# Patient Record
Sex: Male | Born: 1965 | Race: White | Hispanic: No | Marital: Married | Smoking: Never smoker
Health system: Southern US, Community
[De-identification: ages and names within clinical notes are randomized; demographics above are authoritative.]

## PROBLEM LIST (undated history)

## (undated) DIAGNOSIS — I10 Essential (primary) hypertension: Secondary | ICD-10-CM

## (undated) DIAGNOSIS — J189 Pneumonia, unspecified organism: Secondary | ICD-10-CM

## (undated) DIAGNOSIS — K219 Gastro-esophageal reflux disease without esophagitis: Secondary | ICD-10-CM

## (undated) DIAGNOSIS — F410 Panic disorder [episodic paroxysmal anxiety] without agoraphobia: Secondary | ICD-10-CM

## (undated) DIAGNOSIS — H1010 Acute atopic conjunctivitis, unspecified eye: Secondary | ICD-10-CM

---

## 2011-05-27 ENCOUNTER — Encounter (HOSPITAL_COMMUNITY): Payer: Self-pay | Admitting: *Deleted

## 2011-05-27 ENCOUNTER — Emergency Department (HOSPITAL_COMMUNITY): Payer: BC Managed Care – PPO

## 2011-05-27 ENCOUNTER — Observation Stay (HOSPITAL_COMMUNITY)
Admission: EM | Admit: 2011-05-27 | Discharge: 2011-05-28 | Disposition: A | Discharge status: Home / Self Care | Payer: BC Managed Care – PPO | Attending: Emergency Medicine | Admitting: Emergency Medicine

## 2011-05-27 ENCOUNTER — Other Ambulatory Visit: Payer: Self-pay

## 2011-05-27 DIAGNOSIS — R0602 Shortness of breath: Secondary | ICD-10-CM | POA: Insufficient documentation

## 2011-05-27 DIAGNOSIS — R61 Generalized hyperhidrosis: Secondary | ICD-10-CM | POA: Insufficient documentation

## 2011-05-27 DIAGNOSIS — I10 Essential (primary) hypertension: Secondary | ICD-10-CM | POA: Insufficient documentation

## 2011-05-27 DIAGNOSIS — F410 Panic disorder [episodic paroxysmal anxiety] without agoraphobia: Secondary | ICD-10-CM | POA: Insufficient documentation

## 2011-05-27 DIAGNOSIS — R079 Chest pain, unspecified: Principal | ICD-10-CM | POA: Insufficient documentation

## 2011-05-27 DIAGNOSIS — J9859 Other diseases of mediastinum, not elsewhere classified: Secondary | ICD-10-CM

## 2011-05-27 DIAGNOSIS — R42 Dizziness and giddiness: Secondary | ICD-10-CM | POA: Insufficient documentation

## 2011-05-27 DIAGNOSIS — R110 Nausea: Secondary | ICD-10-CM | POA: Insufficient documentation

## 2011-05-27 HISTORY — DX: Gastro-esophageal reflux disease without esophagitis: K21.9

## 2011-05-27 HISTORY — DX: Essential (primary) hypertension: I10

## 2011-05-27 HISTORY — DX: Panic disorder (episodic paroxysmal anxiety): F41.0

## 2011-05-27 LAB — POCT I-STAT TROPONIN I: Troponin i, poc: 0 ng/mL (ref 0.00–0.08)

## 2011-05-27 LAB — POCT I-STAT, CHEM 8
BUN: 12 mg/dL (ref 6–23)
Creatinine, Ser: 1 mg/dL (ref 0.50–1.35)
Glucose, Bld: 117 mg/dL — ABNORMAL HIGH (ref 70–99)
Hemoglobin: 16.3 g/dL (ref 13.0–17.0)
Potassium: 3.4 mEq/L — ABNORMAL LOW (ref 3.5–5.1)

## 2011-05-27 LAB — CBC
Hemoglobin: 15.9 g/dL (ref 13.0–17.0)
MCHC: 34.7 g/dL (ref 30.0–36.0)
Platelets: 200 10*3/uL (ref 150–400)
RDW: 13.2 % (ref 11.5–15.5)

## 2011-05-27 MED ORDER — LORAZEPAM 1 MG PO TABS
0.5000 mg | ORAL_TABLET | Freq: Once | ORAL | Status: AC
Start: 2011-05-27 — End: 2011-05-27
  Administered 2011-05-27: 0.5 mg via ORAL
  Filled 2011-05-27: qty 1

## 2011-05-27 NOTE — ED Notes (Signed)
BMI 34.7; spoke with PA- meets criteria for CTA

## 2011-05-27 NOTE — ED Notes (Signed)
Please call pt's wife if pt's status changes Marti [phone]

## 2011-05-27 NOTE — ED Notes (Signed)
Pt states that intermittent CP started 45 minutes ago with nausea, diaphoresis, radiation to left arm. Pain feels like pressure. Pain relived with burping. Pain exacerbated by nothing.

## 2011-05-27 NOTE — ED Provider Notes (Signed)
History     CSN: [id number]  Arrival date & time 05/27/11  2042   First MD Initiated Contact with Patient 05/27/11 2159      Chief Complaint  Patient presents with  . Chest Pain    (Consider location/radiation/quality/duration/timing/severity/associated sxs/prior treatment) HPI Cc SS CP onset around 8pm today, nonradiating, associated with sob, lightheadedness, diaphoresis.  Cp intermittent, no aggravating or alleviating factors.  Past Medical History  Diagnosis Date  . Panic attacks   . Hypertension     History reviewed. No pertinent past surgical history.  Family History  Problem Relation Age of Onset  . Stroke Father   . Cancer Father   . Cancer Brother     History  Substance Use Topics  . Smoking status: Not on file  . Smokeless tobacco: Never Used  . Alcohol Use: No      Review of Systems  Respiratory: Positive for shortness of breath.   Cardiovascular: Positive for chest pain.  Gastrointestinal: Positive for nausea.  Neurological: Positive for light-headedness.  All other systems reviewed and are negative.    Allergies  Review of patient's allergies indicates no known allergies.  Home Medications   Current Outpatient Rx  Name Route Sig Dispense Refill  . LISINOPRIL 10 MG PO TABS Oral Take 10 mg by mouth daily.    Marland Kitchen LORAZEPAM 0.5 MG PO TABS Oral Take 0.5 mg by mouth every 8 (eight) hours.    Marland Kitchen METOPROLOL SUCCINATE ER 50 MG PO TB24 Oral Take 25 mg by mouth daily. Take with or immediately following a meal.    . OMEPRAZOLE 40 MG PO CPDR Oral Take 40 mg by mouth daily.    Marland Kitchen PAROXETINE HCL 10 MG PO TABS Oral Take 10 mg by mouth every morning.    Marland Kitchen PRESCRIPTION MEDICATION Oral Take 1 tablet by mouth 2 (two) times daily. Oral Testosterone supplement (troche)      BP 147/80  Pulse 60  Temp(Src) 98.6 F (37 C) (Oral)  Resp 20  Ht 6\' 2"  (1.88 m)  Wt 270 lb (122.471 kg)  BMI 34.67 kg/m2  SpO2 96%  Physical Exam  Nursing note and vitals  reviewed. Constitutional: He appears well-developed and well-nourished.  HENT:  Head: Normocephalic and atraumatic.  Eyes: Right eye exhibits no discharge. Left eye exhibits no discharge.  Neck: Normal range of motion. Neck supple.  Cardiovascular: Normal rate, regular rhythm and normal heart sounds.   Pulmonary/Chest: Effort normal and breath sounds normal.  Abdominal: Soft. There is no tenderness.  Musculoskeletal: Normal range of motion. He exhibits no tenderness.  Neurological: He is alert.  Skin: Skin is warm and dry.  Psychiatric: He has a normal mood and affect. His behavior is normal.    ED Course  Procedures (including critical care time)  Labs Reviewed  POCT I-STAT, CHEM 8 - Abnormal; Notable for the following:    Potassium 3.4 (*)    Glucose, Bld 117 (*)    All other components within normal limits  CBC  POCT I-STAT TROPONIN I  POCT I-STAT TROPONIN I   Dg Chest 2 View  05/27/2011  *RADIOLOGY REPORT*  Clinical Data: Chest pain, upper chest pressure, shortness of breath, hot flashes, numbness and tingling left hand, history hypertension  CHEST - 2 VIEW  Comparison: None  Findings: Borderline enlargement of cardiac silhouette. Slight pulmonary vascular congestion. Mediastinal contours normal. Lungs clear. No pleural effusion or pneumothorax. Bones unremarkable.  IMPRESSION: No acute abnormalities.  Original Report Authenticated By: [facility].  Tyron Russell, M.D.     No diagnosis found.   EKG: normal EKG, normal sinus rhythm, there are no previous tracings available for comparison.  MDM  Pt with h/ o htn but no other cad risk factors.  ekg nl.  Low risk for acs but story is concerning.  PERC neg doubt pe.  Doubt dissection, esophageal injury.  No infectious sx's.  Could be associated with anxiety as pt states he has panic attacks that have presented similarly yet never lasted this long.  First trop wnl.  Pt placed in CDU for obs and CT in the am.       Elijio Miles,  MD 05/28/11 [phone]

## 2011-05-27 NOTE — ED Notes (Signed)
Pt c/o chest pain/pressure that is centralized and in the upper chest.  Denies radiation.  Pt [place] at mall and began having pressure and sweating.  Claims nausea but denies V/D.  Pt has history of panic attacks but states he has never had the sweating and return pressure with tingling in left arm and hand.  Alert oriented X4 family sitting with patient.   Pt reports taking testosterone for past two months and wonders if this has caused chest pain

## 2011-05-27 NOTE — ED Provider Notes (Signed)
Pt moved to CDU on chest pain protocol. Pt with CP started just prior to arrival, associated with diaphoresis, radiated to left arm. Pt is now CP free, low risk for ACS. Pt will have Cardiac CT in AM. Pt's BMI is 34.  His VS currently are normal. Pt in NAD. AAOx3. Regular HR and rhythm. Lungs clear to auscultation bilaterally. No LE edema. Pt will be monitored over night.     Lottie Mussel, PA 05/27/11 2320

## 2011-05-28 ENCOUNTER — Other Ambulatory Visit: Payer: Self-pay

## 2011-05-28 ENCOUNTER — Observation Stay (HOSPITAL_COMMUNITY): Payer: BC Managed Care – PPO

## 2011-05-28 ENCOUNTER — Encounter (HOSPITAL_COMMUNITY): Payer: Self-pay | Admitting: *Deleted

## 2011-05-28 LAB — POCT I-STAT TROPONIN I
Troponin i, poc: 0 ng/mL (ref 0.00–0.08)
Troponin i, poc: 0 ng/mL (ref 0.00–0.08)

## 2011-05-28 MED ORDER — METOPROLOL TARTRATE 25 MG PO TABS
100.0000 mg | ORAL_TABLET | Freq: Once | ORAL | Status: AC
  Administered 2011-05-28: 100 mg via ORAL
  Filled 2011-05-28: qty 4

## 2011-05-28 MED ORDER — ACETAMINOPHEN 325 MG PO TABS
650.0000 mg | ORAL_TABLET | Freq: Once | ORAL | Status: AC
  Administered 2011-05-28: 650 mg via ORAL
  Filled 2011-05-28: qty 2

## 2011-05-28 MED ORDER — IOHEXOL 350 MG/ML SOLN
80.0000 mL | Freq: Once | INTRAVENOUS | Status: AC | PRN
  Administered 2011-05-28: 80 mL via INTRAVENOUS

## 2011-05-28 MED ORDER — NITROGLYCERIN 0.4 MG SL SUBL
0.4000 mg | SUBLINGUAL_TABLET | Freq: Once | SUBLINGUAL | Status: AC
  Administered 2011-05-28: 0.4 mg via SUBLINGUAL
  Filled 2011-05-28: qty 25

## 2011-05-28 MED ORDER — METOPROLOL TARTRATE 1 MG/ML IV SOLN
5.0000 mg | Freq: Once | INTRAVENOUS | Status: AC
  Administered 2011-05-28: 5 mg via INTRAVENOUS
  Filled 2011-05-28: qty 10

## 2011-05-28 MED ORDER — LORAZEPAM 1 MG PO TABS
1.0000 mg | ORAL_TABLET | Freq: Once | ORAL | Status: AC
  Administered 2011-05-28: 1 mg via ORAL
  Filled 2011-05-28: qty 1

## 2011-05-28 NOTE — Discharge Instructions (Signed)
As we discussed, there was possible calcification in a tiny branch of the vessels on your heart. As your only risk factor is high blood pressure, no additional consultation is needed in the ER at this time. You should discuss these results with Dr Clifton Custard.  In addition, the 1.4cm soft tissue mass in the mediastinum (the space between the lungs) should be evaluated with a CT of the chest. This can be ordered by Dr Clifton Custard.     Chest Pain (Nonspecific) It is often hard to give a specific diagnosis for the cause of chest pain. There is always a chance that your pain could be related to something serious, such as a heart attack or a blood clot in the lungs. You need to follow up with your caregiver for further evaluation. CAUSES   Heartburn.   Pneumonia or bronchitis.   Anxiety or stress.   Inflammation around your heart (pericarditis) or lung (pleuritis or pleurisy).   A blood clot in the lung.   A collapsed lung (pneumothorax). It can develop suddenly on its own (spontaneous pneumothorax) or from injury (trauma) to the chest.   Shingles infection (herpes zoster virus).  The chest wall is composed of bones, muscles, and cartilage. Any of these can be the source of the pain.  The bones can be bruised by injury.   The muscles or cartilage can be strained by coughing or overwork.   The cartilage can be affected by inflammation and become sore (costochondritis).  DIAGNOSIS  Lab tests or other studies, such as X-rays, electrocardiography, stress testing, or cardiac imaging, may be needed to find the cause of your pain.  TREATMENT   Treatment depends on what may be causing your chest pain. Treatment may include:   Acid blockers for heartburn.   Anti-inflammatory medicine.   Pain medicine for inflammatory conditions.   Antibiotics if an infection is present.   You may be advised to change lifestyle habits. This includes stopping smoking and avoiding alcohol, caffeine, and chocolate.     You may be advised to keep your head raised (elevated) when sleeping. This reduces the chance of acid going backward from your stomach into your esophagus.   Most of the time, nonspecific chest pain will improve within 2 to 3 days with rest and mild pain medicine.  HOME CARE INSTRUCTIONS   If antibiotics were prescribed, take your antibiotics as directed. Finish them even if you start to feel better.   For the next few days, avoid physical activities that bring on chest pain. Continue physical activities as directed.   Do not smoke.   Avoid drinking alcohol.   Only take over-the-counter or prescription medicine for pain, discomfort, or fever as directed by your caregiver.   Follow your caregiver's suggestions for further testing if your chest pain does not go away.   Keep any follow-up appointments you made. If you do not go to an appointment, you could develop lasting (chronic) problems with pain. If there is any problem keeping an appointment, you must call to reschedule.  SEEK MEDICAL CARE IF:   You think you are having problems from the medicine you are taking. Read your medicine instructions carefully.   Your chest pain does not go away, even after treatment.   You develop a rash with blisters on your chest.  SEEK IMMEDIATE MEDICAL CARE IF:   You have increased chest pain or pain that spreads to your arm, neck, jaw, back, or abdomen.   You develop shortness of breath,  an increasing cough, or you are coughing up blood.   You have severe back or abdominal pain, feel nauseous, or vomit.   You develop severe weakness, fainting, or chills.   You have a fever.  THIS IS AN EMERGENCY. Do not wait to see if the pain will go away. Get medical help at once. Call your local emergency services (911 in [place] Do not drive yourself to the hospital. MAKE SURE YOU:   Understand these instructions.   Will watch your condition.   Will get help right away if you are not doing well or  get worse.  Document Released: 11/30/2004 Document Revised: 02/09/2011 Document Reviewed: 09/26/2007 [facility] Patient Information 2012 [place], [place].

## 2011-05-28 NOTE — ED Provider Notes (Signed)
7:52 AM Pt care assumed in CDU at am shift change. 46 y/o M with hx HTN, anxiety on chest pain protocol. Short episode yesterday substernal CP radiating to left arm with assoc nausea, diaphoresis, mild SOB. Symptoms resolved spontaneously. No personal or family hx CAD. Negative troponin x 4. Nml EKG. Awaiting coronary CT.   On my assessment, pt is awake, alert, oriented, and in NAD. Lungs CTAB. Heart RRR. Abd soft, NT, nml bowel sounds. MAEW without edema to BLE. Speech clear. No needs at this time.        11:54 AM Pt still with no chest pain.   05/28/2011  *RADIOLOGY REPORT*  INDICATION:  Acute onset of substernal chest pain.  History of hypertension and gastroesophageal reflux disease.  CT ANGIOGRAPHY OF THE HEART, CORONARY ARTERY, STRUCTURE, AND MORPHOLOGY  CONTRAST: 80mL OMNIPAQUE IOHEXOL 350 MG/ML IV SOLN  COMPARISON:  Plain film of 1 day prior.  No prior CT.  TECHNIQUE:  CT angiography of the coronary vessels was performed on a 256 channel system using prospective ECG gating.  A scout and noncontrast exam (for calcium scoring) were performed.  Circulation time was measured using a test bolus.  Coronary CTA was performed with sub mm slice collimation during portions of the cardiac cycle after prior injection of iodinated contrast.  Imaging post processing was performed on an independent workstation creating multiplanar and 3-D images, and quantitative analysis of the heart and coronary arteries.  Note that this exam targets the heart and the chest was not imaged in its entirety.  PREMEDICATION: Lopressor 100 mg, P.O. Lopressor 5.0 mg, IV Nitroglycerin 0.4 mcg, sublingual.  FINDINGS: Technical quality:  Good.  Heart rate:  54  CORONARY ARTERIES: Left main coronary artery:  A short vessel, arising from the left coronary cusp.  Within normal limits.  A ramus branch which is a moderate-sized branching.  Without significant disease. Left anterior descending:  Gives rise to a moderate-sized, branching  diagonal.  This may be the source of the questionable calcification on unenhanced imaging.  No significant stenosis. Continues as a long vessel, giving rise to a second tiny patent diagonal.  Wraps around cardiac apex, where it supplies a moderate sized distal diagonal, which is patent. Left circumflex:  Immediately gives rise to a diminutive marginal branch, which is patent.  A moderate sized, nondominant vessel. Gives rise to a moderate to large marginal branch, which is branching and negative.  Continues as a moderate sized patent distal vessel.  Supplies a third, moderate sized marginal and continues as a diminutive AV groove branch. Right coronary artery:  Arises from the right coronary cusp.  A large, dominant vessel.  Without significant disease.  Supplies the PDA and posterolateral branches.  Supplies2 small right ventricular branches.  Posterior descending artery:  Relatively diminutive, secondary to the wraparound LAD.  Patent. Dominance:  Right-sided.  CORONARY CALCIUM:  Total Agatston Score:  0.2 MESA database percentile:  26th  Please note that this tiny focus of apparent calcification in a diagonal branch could be artifactual, related to patient body habitus.  AORTA AND PULMONARY MEASUREMENTS: Aortic root (21 - 40 mm):             28  at the annulus             33  at the sinuses of Valsalva             24  at the sinotubular junction Ascending aorta ( <  40 mm):  28 Descending aorta ( <  40 mm):  23 Main pulmonary artery:  ( <  30 mm):  26  EXTRACARDIAC FINDINGS: Lung windows demonstrate no airspace opacities.  Soft tissue windows demonstrate no pericardial or pleural effusion. 1.4 cm incompletely imaged soft tissue density within the anterior mediastinum on image 1 of postcontrast series.  Also on image 1 of precontrast series.  Otherwise, no thoracic adenopathy.  Pulmonary arteries not well opacified.  No aortic dissection or aneurysm.  Normal appearance of the imaged esophagus.  Limited abdominal  imaging demonstrates no significant findings.  No acute osseous abnormality.  IMPRESSION:  1.  No significant coronary artery disease.  Possible tiny focus of calcification within a diagonal branch.  This could alternatively be artifact, secondary patient size.  If real, this would correspond to the 26 percentile for patient age and sex. 2.  Extracardiac findings pertinent for an indeterminate 1.4 cm soft tissue density within the anterior mediastinum.  Incompletely imaged.  Possibly a mildly enlarged lymph node versus residual thymus.  Consider further characterization with chest CT.  This could be performed non emergently, on an outpatient basis. 3.  Right-sided coronary artery dominance.  Report was called to CDU mid level Lunabella Badgett at 11:12 am  on 05/28/2011.  Original Report Authenticated By: Consuello Bossier, M.D.      Coronary CT results as above were discussed with Dr Reche Dixon. I also discussed these results with pt. His only risk factor is HTN (non-smoker, no DM, no HLD). Therefore, no cardiology consult is needed at this time for questionable calcification in diagonal branch. I discussed with the patient the 1.4cm soft tissue density and the need for an outpatient CT scan. He has a primary doctor in [place] with whom he can follow-up closely and who can order this testing.   Pt voices understanding of plan.   Shaaron Adler, [place] 05/28/11 1156

## 2011-05-28 NOTE — ED Notes (Signed)
Patient asked to wake up and walk to restroom in order to get a "awakened" heart rate.  Patient currently sitting up in bed; no respiratory or acute distress noted.  Patient updated on plan of care; informed patient that an EKG will be done at 6am; patient has no other questions or concerns at this time; will continue to monitor.

## 2011-05-28 NOTE — ED Notes (Signed)
Patient returned from CT - pt tolerated well

## 2011-05-28 NOTE — ED Notes (Signed)
Patient currently asleep in bed; no respiratory or acute distress noted.  Will continue to monitor. 

## 2011-05-28 NOTE — ED Notes (Signed)
Received bedside report from Jaci, RN.  Patient currently asleep in bed; no respiratory or acute distress noted.  Will continue to monitor. 

## 2011-05-28 NOTE — ED Provider Notes (Signed)
I saw and evaluated the patient, reviewed the resident's note and I agree with the findings and plan including ECG interpretation.  Medical screening examination/treatment/procedure(s) were conducted as a shared visit with non-physician practitioner(s) and myself.  I personally evaluated the patient during the encounter  Atypical CP syndrome at rest, no exertional Sxs, feel ED CP Obs reasonable.  Patient / Family / Caregiver understand and agree with initial ED impression and plan with expectations set for ED visit.  Hurman Horn, MD 05/29/11 2231

## 2011-05-28 NOTE — ED Notes (Signed)
Patient "wakened" heart rate 72; metoprolol 100 mg ordered, per CDU CP Protocol.

## 2011-05-28 NOTE — ED Notes (Signed)
EKG done at 0607, per CDU CP Protocol.

## 2011-05-29 NOTE — ED Provider Notes (Signed)
Medical screening examination/treatment/procedure(s) were conducted as a shared visit with non-physician practitioner(s) and myself.  I personally evaluated the patient during the encounter  Hurman Horn, MD 05/29/11 [phone]

## 2011-05-29 NOTE — ED Provider Notes (Signed)
Medical screening examination/treatment/procedure(s) were conducted as a shared visit with non-physician practitioner(s) and myself.  I personally evaluated the patient during the encounter  Dashawn Bartnick M Ardon [facility], MD 05/29/11 2301 

## 2011-05-30 ENCOUNTER — Other Ambulatory Visit (HOSPITAL_COMMUNITY): Payer: Self-pay | Admitting: Internal Medicine

## 2011-05-30 DIAGNOSIS — J9859 Other diseases of mediastinum, not elsewhere classified: Secondary | ICD-10-CM

## 2011-06-06 ENCOUNTER — Ambulatory Visit (HOSPITAL_COMMUNITY)
Admission: RE | Admit: 2011-06-06 | Discharge: 2011-06-06 | Disposition: A | Discharge status: Home / Self Care | Payer: BC Managed Care – PPO | Source: Ambulatory Visit | Attending: Internal Medicine | Admitting: Internal Medicine

## 2011-06-06 ENCOUNTER — Other Ambulatory Visit (HOSPITAL_COMMUNITY): Payer: Self-pay | Admitting: Internal Medicine

## 2011-06-06 ENCOUNTER — Encounter (HOSPITAL_COMMUNITY): Payer: Self-pay

## 2011-06-06 DIAGNOSIS — J9859 Other diseases of mediastinum, not elsewhere classified: Secondary | ICD-10-CM

## 2011-06-06 DIAGNOSIS — R222 Localized swelling, mass and lump, trunk: Secondary | ICD-10-CM | POA: Insufficient documentation

## 2011-06-06 MED ORDER — IOHEXOL 300 MG/ML  SOLN
100.0000 mL | Freq: Once | INTRAMUSCULAR | Status: AC | PRN
  Administered 2011-06-06: 100 mL via INTRAVENOUS

## 2011-06-21 DIAGNOSIS — J9859 Other diseases of mediastinum, not elsewhere classified: Secondary | ICD-10-CM | POA: Insufficient documentation

## 2011-06-22 ENCOUNTER — Encounter: Payer: Self-pay | Admitting: Thoracic Surgery (Cardiothoracic Vascular Surgery)

## 2011-06-22 ENCOUNTER — Institutional Professional Consult (permissible substitution) (INDEPENDENT_AMBULATORY_CARE_PROVIDER_SITE_OTHER): Payer: BC Managed Care – PPO | Admitting: Thoracic Surgery (Cardiothoracic Vascular Surgery)

## 2011-06-22 VITALS — BP 123/71 | HR 67 | Resp 18 | Ht 73.0 in | Wt 275.0 lb

## 2011-06-22 DIAGNOSIS — R222 Localized swelling, mass and lump, trunk: Secondary | ICD-10-CM

## 2011-06-22 NOTE — Progress Notes (Signed)
PCP is No primary provider on file. Referring Provider is Bedelia Person, MD  Chief Complaint  Patient presents with  . Mediastinal Mass    Referral from Dr Clifton Custard for eval on Mediastinal mass, Chest CT 06/06/11, CT angio,05/27/11     HPI: 46 year old white male presents with chief complaint of a mass in his chest.  Mr. Denner is a 46 year old gentleman with a history of anxiety and panic attacks as well as hypertension. He experienced an episode of chest pain and shortness of breath after eating recently. He went to the emergency room and [facility] his workup included a CT angiogram of his chest to rule out coronary disease. 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 He then followed up with Dr. Clifton Custard. 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 This lesion was well circumscribed and surrounded by fatty tissue.   Past Medical History  Diagnosis Date  . Panic attacks   . Hypertension   . GERD (gastroesophageal reflux disease)     No past surgical history on file.  Family History  Problem Relation Age of Onset  . Stroke Father   . Cancer Father   . Cancer Brother     Social History History  Substance Use Topics  . Smoking status: Never Smoker   . Smokeless tobacco: Never Used  . Alcohol Use: No    Current Outpatient Prescriptions  Medication Sig Dispense Refill  . lisinopril (PRINIVIL,ZESTRIL) 10 MG tablet Take 10 mg by mouth daily.      Marland Kitchen LORazepam (ATIVAN) 0.5 MG tablet Take 0.5 mg by mouth every 8 (eight) hours.      . metoprolol succinate (TOPROL-XL) 50 MG 24 hr tablet Take 25 mg by mouth daily. Take with or immediately following a meal.      . omeprazole (PRILOSEC) 40 MG capsule Take 40 mg by mouth daily.      Marland Kitchen PARoxetine (PAXIL) 10 MG tablet Take 10 mg by mouth every morning.      Marland Kitchen PRESCRIPTION MEDICATION Take 1 tablet by mouth 2 (two) times daily. Oral Testosterone supplement  (troche)        Allergies  Allergen Reactions  . Niaspan (Niacin (Antihyperlipidemic))     flushing  . Penicillins Other (See Comments)    Childhood allergy " does not remember"    Review of Systems  Respiratory: Positive for shortness of breath.   Cardiovascular: Positive for chest pain.  Psychiatric/Behavioral:       Panic attacks, anxiety  All other systems reviewed and are negative.    BP 123/71  Pulse 67  Resp 18  Ht 6\' 1"  (1.854 m)  Wt 275 lb (124.739 kg)  BMI 36.28 kg/m2  SpO2 96% Physical Exam  Vitals reviewed. Constitutional: He is oriented to person, place, and time. He appears well-developed and well-nourished. No distress.  HENT:  Head: Normocephalic and atraumatic.  Eyes: EOM are normal. Pupils are equal, round, and reactive to light.  Neck: Neck supple. No tracheal deviation present. No thyromegaly present.  Cardiovascular: Normal rate, regular rhythm, normal heart sounds and intact distal pulses.  Exam reveals no gallop and no friction rub.   No murmur heard. Pulmonary/Chest: Breath sounds normal. He has no wheezes. He has no rales.  Musculoskeletal: Normal range of motion. He exhibits no edema.  Lymphadenopathy:    He has no cervical adenopathy.  Neurological: He is alert and  oriented to person, place, and time. No cranial nerve deficit.  Skin: Skin is warm and dry.  Psychiatric: He has a normal mood and affect.     Diagnostic Tests: CT Chest 06/06/2011 IMPRESSION:  1. The only finding of note is a 1.4 x 1.2 cm anterior mediastinal  soft tissue density. No surrounding hypervascularity, fatty  component, or internal calcification is noted. Differential  diagnostic considerations include an isolated reactive lymph node,  lymphoma, germ cell tumor, high density mediastinal cyst, or  ectopic thyroid/parathyroid tissue. Nuclear medicine PET CT may be  helpful in assessing for hypermetabolic activity associated with  neoplasm; alternatively, biopsy or  careful CT or MRI imaging  observation may be utilized. Careful physical exam to assess the  patient for adenopathy at other sites (neck, groin, etc.) is also  recommended.   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 The other possibilities the differential diagnoses include ectopic thyroid tissue, lymphoma (although this would be an extraordinarily unusual presentation), and germ cell tumor.  I reviewed the films with Mr. and Mrs. Cato and also discussed the differential diagnosis.  I discussed her options. I do not think a PET scan would be particularly helpful in this setting. One option would be to follow him with serial CT scans. The second option would be to go ahead and remove this surgically for definitive diagnosis and treatment. Given the small size of lesion I think either option is reasonable. I did discuss with him the relative advantages and disadvantages of each option. I discussed with our surgical options including partial upper sternotomy or a right VATS approach. I given his size as well as the small size and location of the nodule, I think a right VATS approach would be best in his case. I discussed with them the indications, risks, benefits, and alternatives. He understands the risk of surgery include but are not limited to death, MI, DVT, PE, infection, bleeding, small possibility of need for a full thoracotomy or sternotomy for control of bleeding. We discussed expected hospital stay which be 2-3 days. With a likely return to work in 3-4 weeks.  He is leaning heavily towards proceeding with surgical resection of the lesion, but wishes take some time to think over his options. He will call and let us know what he decides to the next few days.  Plan:

## 2011-06-26 ENCOUNTER — Other Ambulatory Visit: Payer: Self-pay

## 2011-06-26 ENCOUNTER — Encounter (HOSPITAL_COMMUNITY): Payer: Self-pay | Admitting: Pharmacy Technician

## 2011-06-26 DIAGNOSIS — R222 Localized swelling, mass and lump, trunk: Secondary | ICD-10-CM

## 2011-06-29 ENCOUNTER — Encounter (HOSPITAL_COMMUNITY): Payer: Self-pay

## 2011-06-29 ENCOUNTER — Encounter (HOSPITAL_COMMUNITY)
Admission: RE | Admit: 2011-06-29 | Discharge: 2011-06-29 | Disposition: A | Discharge status: Home / Self Care | Payer: BC Managed Care – PPO | Source: Ambulatory Visit | Attending: Thoracic Surgery (Cardiothoracic Vascular Surgery) | Admitting: Thoracic Surgery (Cardiothoracic Vascular Surgery)

## 2011-06-29 VITALS — BP 150/81 | HR 50 | Temp 97.4°F | Resp 16 | Ht 73.0 in | Wt 277.9 lb

## 2011-06-29 DIAGNOSIS — R222 Localized swelling, mass and lump, trunk: Secondary | ICD-10-CM

## 2011-06-29 HISTORY — DX: Pneumonia, unspecified organism: J18.9

## 2011-06-29 HISTORY — DX: Acute atopic conjunctivitis, unspecified eye: H10.10

## 2011-06-29 LAB — BLOOD GAS, ARTERIAL
FIO2: 0.21 %
Patient temperature: 98.6
pH, Arterial: 7.427 (ref 7.350–7.450)

## 2011-06-29 LAB — COMPREHENSIVE METABOLIC PANEL
Albumin: 3.8 g/dL (ref 3.5–5.2)
Alkaline Phosphatase: 78 U/L (ref 39–117)
BUN: 12 mg/dL (ref 6–23)
Potassium: 4 mEq/L (ref 3.5–5.1)
Sodium: 138 mEq/L (ref 135–145)
Total Protein: 6.9 g/dL (ref 6.0–8.3)

## 2011-06-29 LAB — TYPE AND SCREEN
ABO/RH(D): O NEG
Antibody Screen: NEGATIVE

## 2011-06-29 LAB — URINALYSIS, ROUTINE W REFLEX MICROSCOPIC
Bilirubin Urine: NEGATIVE
Ketones, ur: NEGATIVE mg/dL
Nitrite: NEGATIVE
Protein, ur: NEGATIVE mg/dL
Specific Gravity, Urine: 1.013 (ref 1.005–1.030)
Urobilinogen, UA: 0.2 mg/dL (ref 0.0–1.0)

## 2011-06-29 LAB — PROTIME-INR
INR: 1.01 (ref 0.00–1.49)
Prothrombin Time: 13.5 seconds (ref 11.6–15.2)

## 2011-06-29 LAB — CBC
HCT: 45.4 % (ref 39.0–52.0)
MCHC: 35.2 g/dL (ref 30.0–36.0)
RDW: 12.8 % (ref 11.5–15.5)

## 2011-06-29 LAB — SURGICAL PCR SCREEN: MRSA, PCR: NEGATIVE

## 2011-06-29 NOTE — Pre-Procedure Instructions (Signed)
[address] Preston Cole  06/29/2011   Your procedure is scheduled on:  April 29,2013 @ 0730  Report to [facility] at 0530 AM.  Call this number if you have problems the morning of surgery: [phone]   Remember:   Do not eat food:After Midnight.  May have clear liquids: up to 4 Hours before arrival.  Clear liquids include soda, tea, black coffee, apple or grape juice, broth.  Take these medicines the morning of surgery with A SIP OF WATER: Metoprolol, prilosec, paxil, ativan if needed   Do not wear jewelry  Do not wear lotions, powders, or perfumes.   Do not bring valuables to the hospital.  Contacts, dentures or bridgework may not be worn into surgery.  Leave suitcase in the car. After surgery it may be brought to your room.  For patients admitted to the hospital, checkout time is 11:00 AM the day of discharge.     Please read over the following fact sheets that you were given: Pain Booklet, Coughing and Deep Breathing, Blood Transfusion Information, MRSA Information and Surgical Site Infection Prevention

## 2011-06-29 NOTE — Progress Notes (Signed)
Patient does not have a cardiologist.  Echocardiogram few years ago - [facility] hospital - will request records. Stress test 2 years ago in [facility] hospital - will request records.  Medical Physican - Dr. Clifton Custard

## 2011-07-02 MED ORDER — VANCOMYCIN HCL 1000 MG IV SOLR
1500.0000 mg | INTRAVENOUS | Status: AC
  Administered 2011-07-03: 1500 mg via INTRAVENOUS
  Filled 2011-07-02: qty 1500

## 2011-07-03 ENCOUNTER — Inpatient Hospital Stay (HOSPITAL_COMMUNITY): Payer: BC Managed Care – PPO

## 2011-07-03 ENCOUNTER — Encounter (HOSPITAL_COMMUNITY)
Admission: RE | Disposition: A | Discharge status: Home / Self Care | Payer: Self-pay | Source: Ambulatory Visit | Attending: Thoracic Surgery (Cardiothoracic Vascular Surgery)

## 2011-07-03 ENCOUNTER — Encounter (HOSPITAL_COMMUNITY): Payer: Self-pay | Admitting: Certified Registered"

## 2011-07-03 ENCOUNTER — Ambulatory Visit (HOSPITAL_COMMUNITY): Payer: BC Managed Care – PPO | Admitting: Certified Registered"

## 2011-07-03 ENCOUNTER — Encounter (HOSPITAL_COMMUNITY): Payer: Self-pay | Admitting: Surgery

## 2011-07-03 ENCOUNTER — Inpatient Hospital Stay (HOSPITAL_COMMUNITY)
Admission: RE | Admit: 2011-07-03 | Discharge: 2011-07-06 | DRG: 075 | Disposition: A | Discharge status: Home / Self Care | Payer: BC Managed Care – PPO | Source: Ambulatory Visit | Attending: Thoracic Surgery (Cardiothoracic Vascular Surgery) | Admitting: Thoracic Surgery (Cardiothoracic Vascular Surgery)

## 2011-07-03 DIAGNOSIS — I10 Essential (primary) hypertension: Secondary | ICD-10-CM | POA: Diagnosis present

## 2011-07-03 DIAGNOSIS — R222 Localized swelling, mass and lump, trunk: Secondary | ICD-10-CM

## 2011-07-03 DIAGNOSIS — K219 Gastro-esophageal reflux disease without esophagitis: Secondary | ICD-10-CM | POA: Diagnosis present

## 2011-07-03 DIAGNOSIS — F410 Panic disorder [episodic paroxysmal anxiety] without agoraphobia: Secondary | ICD-10-CM | POA: Diagnosis present

## 2011-07-03 DIAGNOSIS — J984 Other disorders of lung: Principal | ICD-10-CM | POA: Diagnosis present

## 2011-07-03 DIAGNOSIS — Z01812 Encounter for preprocedural laboratory examination: Secondary | ICD-10-CM

## 2011-07-03 HISTORY — PX: VIDEO ASSISTED THORACOSCOPY: SHX5073

## 2011-07-03 SURGERY — VIDEO ASSISTED THORACOSCOPY
Anesthesia: General | Site: Chest | Laterality: Right | Wound class: Clean Contaminated

## 2011-07-03 MED ORDER — ONDANSETRON HCL 4 MG/2ML IJ SOLN: 4.0000 mg | Freq: Four times a day (QID) | INTRAMUSCULAR | Status: DC | PRN

## 2011-07-03 MED ORDER — DIPHENHYDRAMINE HCL 50 MG/ML IJ SOLN: 12.5000 mg | Freq: Four times a day (QID) | INTRAMUSCULAR | Status: DC | PRN

## 2011-07-03 MED ORDER — ONDANSETRON HCL 4 MG/2ML IJ SOLN
4.0000 mg | Freq: Four times a day (QID) | INTRAMUSCULAR | Status: DC | PRN
Start: 2011-07-03 — End: 2011-07-06

## 2011-07-03 MED ORDER — BUPIVACAINE ON-Q PAIN PUMP (FOR ORDER SET NO CHG)
INJECTION | Status: DC
  Filled 2011-07-03: qty 1

## 2011-07-03 MED ORDER — OXYCODONE-ACETAMINOPHEN 5-325 MG PO TABS
1.0000 | ORAL_TABLET | ORAL | Status: DC | PRN
  Administered 2011-07-05: 2 via ORAL
  Filled 2011-07-03: qty 2

## 2011-07-03 MED ORDER — 0.9 % SODIUM CHLORIDE (POUR BTL) OPTIME
TOPICAL | Status: DC | PRN
  Administered 2011-07-03: 2000 mL

## 2011-07-03 MED ORDER — LEVALBUTEROL HCL 0.63 MG/3ML IN NEBU
0.6300 mg | INHALATION_SOLUTION | Freq: Four times a day (QID) | RESPIRATORY_TRACT | Status: DC
  Administered 2011-07-03 – 2011-07-04 (×6): 0.63 mg via RESPIRATORY_TRACT
  Filled 2011-07-03 (×8): qty 3

## 2011-07-03 MED ORDER — NALOXONE HCL 0.4 MG/ML IJ SOLN: 0.4000 mg | INTRAMUSCULAR | Status: DC | PRN

## 2011-07-03 MED ORDER — METOPROLOL SUCCINATE ER 25 MG PO TB24
25.0000 mg | ORAL_TABLET | Freq: Every day | ORAL | Status: DC
  Administered 2011-07-04 – 2011-07-06 (×3): 25 mg via ORAL
  Filled 2011-07-03 (×3): qty 1

## 2011-07-03 MED ORDER — FENTANYL 10 MCG/ML IV SOLN
INTRAVENOUS | Status: DC
  Administered 2011-07-03: 13:00:00 via INTRAVENOUS
  Administered 2011-07-03 – 2011-07-04 (×2): 60 ug via INTRAVENOUS
  Administered 2011-07-04: 04:00:00 via INTRAVENOUS
  Administered 2011-07-04: 90 ug via INTRAVENOUS
  Administered 2011-07-05: 45 ug via INTRAVENOUS
  Administered 2011-07-05: 10.999 ug via INTRAVENOUS
  Administered 2011-07-05 (×2): 45 ug via INTRAVENOUS
  Filled 2011-07-03 (×2): qty 50

## 2011-07-03 MED ORDER — LACTATED RINGERS IV SOLN
INTRAVENOUS | Status: DC | PRN
  Administered 2011-07-03: 07:00:00 via INTRAVENOUS

## 2011-07-03 MED ORDER — NEOSTIGMINE METHYLSULFATE 1 MG/ML IJ SOLN
INTRAMUSCULAR | Status: DC | PRN
  Administered 2011-07-03: 3 mg via INTRAVENOUS

## 2011-07-03 MED ORDER — DIPHENHYDRAMINE HCL 12.5 MG/5ML PO ELIX
12.5000 mg | ORAL_SOLUTION | Freq: Four times a day (QID) | ORAL | Status: DC | PRN
  Filled 2011-07-03: qty 5

## 2011-07-03 MED ORDER — LACTATED RINGERS IV SOLN: INTRAVENOUS | Status: DC

## 2011-07-03 MED ORDER — ONDANSETRON HCL 4 MG/2ML IJ SOLN
INTRAMUSCULAR | Status: DC | PRN
  Administered 2011-07-03: 4 mg via INTRAVENOUS

## 2011-07-03 MED ORDER — LISINOPRIL 10 MG PO TABS
10.0000 mg | ORAL_TABLET | Freq: Every day | ORAL | Status: DC
  Administered 2011-07-04 – 2011-07-06 (×3): 10 mg via ORAL
  Filled 2011-07-03 (×3): qty 1

## 2011-07-03 MED ORDER — DEXTROSE-NACL 5-0.45 % IV SOLN
INTRAVENOUS | Status: DC
  Administered 2011-07-03 – 2011-07-04 (×2): via INTRAVENOUS

## 2011-07-03 MED ORDER — SODIUM CHLORIDE 0.9 % IJ SOLN
9.0000 mL | INTRAMUSCULAR | Status: DC | PRN
  Administered 2011-07-04: 10 mL via INTRAVENOUS

## 2011-07-03 MED ORDER — HYDROMORPHONE HCL PF 1 MG/ML IJ SOLN
0.2500 mg | INTRAMUSCULAR | Status: DC | PRN
  Administered 2011-07-03 (×4): 0.5 mg via INTRAVENOUS

## 2011-07-03 MED ORDER — GLYCOPYRROLATE 0.2 MG/ML IJ SOLN
INTRAMUSCULAR | Status: DC | PRN
  Administered 2011-07-03: 0.6 mg via INTRAVENOUS

## 2011-07-03 MED ORDER — OXYCODONE HCL 5 MG PO TABS: 5.0000 mg | ORAL_TABLET | ORAL | Status: AC | PRN

## 2011-07-03 MED ORDER — ROCURONIUM BROMIDE 100 MG/10ML IV SOLN
INTRAVENOUS | Status: DC | PRN
  Administered 2011-07-03: 50 mg via INTRAVENOUS

## 2011-07-03 MED ORDER — MIDAZOLAM HCL 5 MG/5ML IJ SOLN
INTRAMUSCULAR | Status: DC | PRN
  Administered 2011-07-03: 2 mg via INTRAVENOUS

## 2011-07-03 MED ORDER — BUPIVACAINE 0.5 % ON-Q PUMP SINGLE CATH 400 ML
400.0000 mL | INJECTION | Status: DC
  Filled 2011-07-03: qty 400

## 2011-07-03 MED ORDER — VECURONIUM BROMIDE 10 MG IV SOLR
INTRAVENOUS | Status: DC | PRN
  Administered 2011-07-03: 2 mg via INTRAVENOUS
  Administered 2011-07-03 (×3): 1 mg via INTRAVENOUS
  Administered 2011-07-03: 3 mg via INTRAVENOUS

## 2011-07-03 MED ORDER — ACETAMINOPHEN 10 MG/ML IV SOLN
1000.0000 mg | Freq: Four times a day (QID) | INTRAVENOUS | Status: AC
  Administered 2011-07-03 – 2011-07-04 (×4): 1000 mg via INTRAVENOUS
  Filled 2011-07-03 (×4): qty 100

## 2011-07-03 MED ORDER — ENOXAPARIN SODIUM 40 MG/0.4ML ~~LOC~~ SOLN
40.0000 mg | Freq: Two times a day (BID) | SUBCUTANEOUS | Status: DC
  Administered 2011-07-04 – 2011-07-05 (×4): 40 mg via SUBCUTANEOUS
  Filled 2011-07-03 (×7): qty 0.4

## 2011-07-03 MED ORDER — VANCOMYCIN HCL IN DEXTROSE 1-5 GM/200ML-% IV SOLN
1000.0000 mg | Freq: Two times a day (BID) | INTRAVENOUS | Status: AC
  Administered 2011-07-03: 1000 mg via INTRAVENOUS
  Filled 2011-07-03: qty 200

## 2011-07-03 MED ORDER — PAROXETINE HCL 10 MG PO TABS
10.0000 mg | ORAL_TABLET | Freq: Every day | ORAL | Status: DC
  Administered 2011-07-04 – 2011-07-06 (×3): 10 mg via ORAL
  Filled 2011-07-03 (×3): qty 1

## 2011-07-03 MED ORDER — PANTOPRAZOLE SODIUM 40 MG PO TBEC
80.0000 mg | DELAYED_RELEASE_TABLET | Freq: Every day | ORAL | Status: DC
  Administered 2011-07-04 – 2011-07-05 (×2): 80 mg via ORAL
  Filled 2011-07-03: qty 2
  Filled 2011-07-03: qty 1

## 2011-07-03 MED ORDER — SENNOSIDES-DOCUSATE SODIUM 8.6-50 MG PO TABS
1.0000 | ORAL_TABLET | Freq: Every evening | ORAL | Status: DC | PRN
  Filled 2011-07-03: qty 1

## 2011-07-03 MED ORDER — FENTANYL CITRATE 0.05 MG/ML IJ SOLN
INTRAMUSCULAR | Status: DC | PRN
  Administered 2011-07-03: 50 ug via INTRAVENOUS
  Administered 2011-07-03: 100 ug via INTRAVENOUS
  Administered 2011-07-03 (×4): 50 ug via INTRAVENOUS
  Administered 2011-07-03: 100 ug via INTRAVENOUS
  Administered 2011-07-03: 50 ug via INTRAVENOUS

## 2011-07-03 MED ORDER — KETOROLAC TROMETHAMINE 30 MG/ML IJ SOLN
30.0000 mg | Freq: Four times a day (QID) | INTRAMUSCULAR | Status: AC
  Administered 2011-07-03 – 2011-07-05 (×7): 30 mg via INTRAVENOUS
  Filled 2011-07-03 (×10): qty 1

## 2011-07-03 MED ORDER — BISACODYL 5 MG PO TBEC
10.0000 mg | DELAYED_RELEASE_TABLET | Freq: Every day | ORAL | Status: DC
  Administered 2011-07-04 – 2011-07-05 (×2): 10 mg via ORAL
  Filled 2011-07-03 (×2): qty 2

## 2011-07-03 MED ORDER — PROPOFOL 10 MG/ML IV EMUL
INTRAVENOUS | Status: DC | PRN
  Administered 2011-07-03: 200 mg via INTRAVENOUS

## 2011-07-03 MED ORDER — POTASSIUM CHLORIDE 10 MEQ/50ML IV SOLN
10.0000 meq | Freq: Every day | INTRAVENOUS | Status: DC | PRN
  Filled 2011-07-03: qty 50

## 2011-07-03 SURGICAL SUPPLY — 71 items
APPLIER CLIP ROT 10 11.4 M/L (STAPLE)
CANISTER SUCTION 2500CC (MISCELLANEOUS) ×4 IMPLANT
CATH KIT ON Q 5IN SLV (PAIN MANAGEMENT) ×2 IMPLANT
CATH THORACIC 28FR (CATHETERS) IMPLANT
CATH THORACIC 28FR RT ANG (CATHETERS) ×2 IMPLANT
CATH THORACIC 36FR (CATHETERS) IMPLANT
CATH THORACIC 36FR RT ANG (CATHETERS) IMPLANT
CLIP APPLIE ROT 10 11.4 M/L (STAPLE) IMPLANT
CLIP TI MEDIUM 6 (CLIP) ×2 IMPLANT
CLOTH BEACON ORANGE TIMEOUT ST (SAFETY) ×2 IMPLANT
CONN ST 1/4X3/8  BEN (MISCELLANEOUS) ×1
CONN ST 1/4X3/8 BEN (MISCELLANEOUS) ×1 IMPLANT
CONN Y 3/8X3/8X3/8  BEN (MISCELLANEOUS)
CONN Y 3/8X3/8X3/8 BEN (MISCELLANEOUS) IMPLANT
CONT SPEC 4OZ CLIKSEAL STRL BL (MISCELLANEOUS) ×4 IMPLANT
DERMABOND ADHESIVE PROPEN (GAUZE/BANDAGES/DRESSINGS) ×1
DERMABOND ADVANCED .7 DNX6 (GAUZE/BANDAGES/DRESSINGS) ×1 IMPLANT
DRAIN CHANNEL 28F RND 3/8 FF (WOUND CARE) ×2 IMPLANT
DRAPE LAPAROSCOPIC ABDOMINAL (DRAPES) ×2 IMPLANT
DRAPE SLUSH MACHINE 52X66 (DRAPES) IMPLANT
DRAPE SLUSH/WARMER DISC (DRAPES) IMPLANT
ELECT REM PT RETURN 9FT ADLT (ELECTROSURGICAL) ×2
ELECTRODE REM PT RTRN 9FT ADLT (ELECTROSURGICAL) ×1 IMPLANT
GLOVE BIOGEL PI IND STRL 6.5 (GLOVE) ×3 IMPLANT
GLOVE BIOGEL PI IND STRL 7.5 (GLOVE) ×2 IMPLANT
GLOVE BIOGEL PI INDICATOR 6.5 (GLOVE) ×3
GLOVE BIOGEL PI INDICATOR 7.5 (GLOVE) ×2
GLOVE EUDERMIC 7 POWDERFREE (GLOVE) ×4 IMPLANT
GOWN PREVENTION PLUS XLARGE (GOWN DISPOSABLE) ×2 IMPLANT
GOWN STRL NON-REIN LRG LVL3 (GOWN DISPOSABLE) ×6 IMPLANT
HEMOSTAT SURGICEL 2X14 (HEMOSTASIS) IMPLANT
KIT BASIN OR (CUSTOM PROCEDURE TRAY) ×2 IMPLANT
KIT ROOM TURNOVER OR (KITS) ×2 IMPLANT
KIT SUCTION CATH 14FR (SUCTIONS) ×2 IMPLANT
NS IRRIG 1000ML POUR BTL (IV SOLUTION) ×4 IMPLANT
PACK CHEST (CUSTOM PROCEDURE TRAY) ×2 IMPLANT
PAD ARMBOARD 7.5X6 YLW CONV (MISCELLANEOUS) ×8 IMPLANT
POUCH ENDO CATCH II 15MM (MISCELLANEOUS) IMPLANT
SEALANT PROGEL (MISCELLANEOUS) IMPLANT
SEALANT SURG COSEAL 4ML (VASCULAR PRODUCTS) IMPLANT
SEALANT SURG COSEAL 8ML (VASCULAR PRODUCTS) IMPLANT
SOLUTION ANTI FOG 6CC (MISCELLANEOUS) ×2 IMPLANT
SPECIMEN JAR MEDIUM (MISCELLANEOUS) ×2 IMPLANT
SPONGE GAUZE 4X4 12PLY (GAUZE/BANDAGES/DRESSINGS) ×2 IMPLANT
SPONGE INTESTINAL PEANUT (DISPOSABLE) IMPLANT
SUT PROLENE 4 0 RB 1 (SUTURE)
SUT PROLENE 4-0 RB1 .5 CRCL 36 (SUTURE) IMPLANT
SUT SILK  1 MH (SUTURE) ×2
SUT SILK 1 MH (SUTURE) ×2 IMPLANT
SUT SILK 2 0SH CR/8 30 (SUTURE) IMPLANT
SUT SILK 3 0SH CR/8 30 (SUTURE) ×2 IMPLANT
SUT VIC AB 1 CTX 36 (SUTURE) ×2
SUT VIC AB 1 CTX36XBRD ANBCTR (SUTURE) ×2 IMPLANT
SUT VIC AB 2-0 CTX 36 (SUTURE) ×2 IMPLANT
SUT VIC AB 2-0 UR6 27 (SUTURE) ×2 IMPLANT
SUT VIC AB 3-0 MH 27 (SUTURE) IMPLANT
SUT VIC AB 3-0 X1 27 (SUTURE) ×4 IMPLANT
SUT VICRYL 2 TP 1 (SUTURE) ×2 IMPLANT
SWAB COLLECTION DEVICE MRSA (MISCELLANEOUS) IMPLANT
SYSTEM SAHARA CHEST DRAIN ATS (WOUND CARE) ×2 IMPLANT
TAPE CLOTH 4X10 WHT NS (GAUZE/BANDAGES/DRESSINGS) ×2 IMPLANT
TAPE CLOTH SURG 4X10 WHT LF (GAUZE/BANDAGES/DRESSINGS) ×2 IMPLANT
TIP APPLICATOR SPRAY EXTEND 16 (VASCULAR PRODUCTS) IMPLANT
TOWEL OR 17X24 6PK STRL BLUE (TOWEL DISPOSABLE) ×2 IMPLANT
TOWEL OR 17X26 10 PK STRL BLUE (TOWEL DISPOSABLE) ×2 IMPLANT
TRAP SPECIMEN MUCOUS 40CC (MISCELLANEOUS) IMPLANT
TRAY FOLEY CATH 14FRSI W/METER (CATHETERS) ×2 IMPLANT
TUBE ANAEROBIC SPECIMEN COL (MISCELLANEOUS) IMPLANT
TUBE CONNECTING 12X1/4 (SUCTIONS) ×2 IMPLANT
TUNNELER SHEATH ON-Q 11GX8 (MISCELLANEOUS) ×2 IMPLANT
WATER STERILE IRR 1000ML POUR (IV SOLUTION) ×4 IMPLANT

## 2011-07-03 NOTE — Op Note (Signed)
Cole, Preston NO.:  [id number]  MEDICAL RECORD NO.:  [id number]  LOCATION:  2029                         FACILITY:  [facility]  PHYSICIAN:  Salvatore Decent. Dorris Fetch, M.D.DATE OF BIRTH:  1966-03-04  DATE OF PROCEDURE:  07/03/2011 DATE OF DISCHARGE:                              OPERATIVE REPORT   PREOPERATIVE DIAGNOSIS:  Anterior mediastinal mass.  POSTOPERATIVE DIAGNOSIS:  Anterior mediastinal mass.  PROCEDURE:  Right video-assisted thoracoscopy, thymectomy.  SURGEON:  Salvatore Decent. Dorris Fetch, M.D.  ASSISTANT:  Preston Dandy, PA-C.  ANESTHESIA:  General.  FINDINGS:  Approximately 1 cm anterior mediastinal mass contained within the confines of thymic tissue, no evidence of invasion of surrounding structures.  CLINICAL INDICATION:  Preston Cole is a 46 year old gentleman.  He was recently being evaluated for chest pain and shortness of breath.  CT scan was done, which showed no evidence of pulmonary embolus or coronary disease, but did show a 1.4-cm anterior mediastinal mass.  This was a well-circumscribed lesion surrounded by fatty tissue.  The patient was offered the choice between surgical resection and continued radiographic followup, and he wished to go ahead and have the lesion resected.  The indications, risks, benefits, and alternatives were discussed in detail with the patient.  He understood and accepted the risks and agreed to proceed.  OPERATIVE NOTE:  Preston Cole was brought to the preoperative holding area on July 03, 2011.  There, the Anesthesia Service placed a central line and arterial blood pressure monitoring line.  He was taken to the operating room, given intravenous antibiotics.  He was anesthetized and intubated.  A Foley catheter was placed.  PASs were placed for DVT prophylaxis.  It should be noted that he was intubated with a double- lumen endotracheal tube.  He was placed in the left lateral decubitus position.  The right chest was  prepped and draped in usual fashion.  A single-lung ventilation of the left lung was carried out, was tolerated well throughout the procedure.  An incision was made in the 7th intercostal space in the midaxillary line, was carried through the skin and subcutaneous tissue.  The chest was entered bluntly using a hemostat.  The port was inserted through this incision and a thoracoscope was placed into the chest via the port.  There was no cross ventilation of the right lung, but it was relatively slow to deflate. Suction was applied to the endotracheal tube, which helped to deflate the lung.  A retraction port incision was made below the tip of the scapula.  Through this, a clamp was placed to grasp the lung and pull it laterally exposing the mediastinum.  The patient had a relatively large mediastinal fat pad.  A small utility incision was made anterolaterally, this was approximately 6 cm in length, this was carried through the skin and subcutaneous tissue.  The muscular fibers were separated and the intercostal muscles were divided.  The anterior mediastinal fat pad then was grasped overlying the pericardium.  The phrenic nerve was identified and preserved.  The attachments connecting the fat pad to the pericardium were divided with electrocautery.  The overlying pleura was divided with cautery parallel to the phrenic nerve and  anteriorly as well.  The thymus and fatty tissue were dissected carefully off of the superior vena cava.  As the innominate was approached, vessels were doubly clipped and divided.  Despite this, there was bleeding from a vein, which required additional clips placed, and the vein had been divided at the site of a trifurcation.  The upper poles of the thyroid were taken with the specimen, first the right-sided upper pole, clips were applied to the pedicle prior to dividing it.  Next the left upper pole was dissected out.  Finally, the dissection continued  inferiorly and over to the left side.  The left pleural space was entered during the dissection.  All of the anterior mediastinal fat pad was freed up and finally removed from the chest.  The mass was palpable.  It was approximately a centimeter in size and was soft and mobile within the fatty tissue.  There was no evidence of invasion of surrounding structures.  The bed of the resection was inspected, there was a good hemostasis.  A 28-French Blake drain was placed along the anterior mediastinum.  As the right lung was reinflated, the scope was withdrawn. The chest tube was secured with #1 silk sutures.  The posterior port incision was closed with a 2-0 Vicryl fascial suture and a 3-0 Vicryl subcuticular suture.  The anterior incision was closed with a #1 Vicryl suture to reapproximate the muscular layer followed by 2-0 Vicryl subcutaneous suture and a 3-0 Vicryl subcuticular suture.  All sponge, needle, instrument counts were correct at the end of the procedure.  The patient was taken from the operating room to the Postanesthetic Care Unit in good condition.     Salvatore Decent Dorris Fetch, M.D.     SCH/MEDQ  D:  07/03/2011  T:  07/03/2011  Job:  [id number]

## 2011-07-03 NOTE — Transfer of Care (Signed)
Immediate Anesthesia Transfer of Care Note  Patient: Preston Cole  Procedure(s) Performed: Procedure(s) (LRB): VIDEO ASSISTED THORACOSCOPY (Right)  Patient Location: PACU  Anesthesia Type: General  Level of Consciousness: awake, alert , oriented and patient cooperative  Airway & Oxygen Therapy: Patient Spontanous Breathing and Patient connected to face mask oxygen  Post-op Assessment: Report given to PACU RN, Post -op Vital signs reviewed and stable and Patient moving all extremities  Post vital signs: Reviewed and stable  Complications: No apparent anesthesia complications

## 2011-07-03 NOTE — H&P (Signed)
PCP is No primary provider on file.  Referring Provider is Bedelia Person, MD  Chief Complaint   Patient presents with   .  Mediastinal Mass     Referral from Dr Clifton Custard for eval on Mediastinal mass, Chest CT 06/06/11, CT angio,05/27/11    HPI: 46 year old white male presents with chief complaint of a mass in his chest.  Preston Cole is a 46 year old gentleman with a history of anxiety and panic attacks as well as hypertension. He experienced an episode of chest pain and shortness of breath after eating recently. He went to the emergency room and [facility] his workup included a CT angiogram of his chest to rule out coronary disease. 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 He then followed up with Dr. Clifton Custard. 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 This lesion was well circumscribed and surrounded by fatty tissue.  Past Medical History   Diagnosis  Date   .  Panic attacks    .  Hypertension    .  GERD (gastroesophageal reflux disease)     No past surgical history on file.  Family History   Problem  Relation  Age of Onset   .  Stroke  Father    .  Cancer  Father    .  Cancer  Brother     Social History  History   Substance Use Topics   .  Smoking status:  Never Smoker   .  Smokeless tobacco:  Never Used   .  Alcohol Use:  No    Current Outpatient Prescriptions   Medication  Sig  Dispense  Refill   .  lisinopril (PRINIVIL,ZESTRIL) 10 MG tablet  Take 10 mg by mouth daily.     Marland Kitchen  LORazepam (ATIVAN) 0.5 MG tablet  Take 0.5 mg by mouth every 8 (eight) hours.     .  metoprolol succinate (TOPROL-XL) 50 MG 24 hr tablet  Take 25 mg by mouth daily. Take with or immediately following a meal.     .  omeprazole (PRILOSEC) 40 MG capsule  Take 40 mg by mouth daily.     Marland Kitchen  PARoxetine (PAXIL) 10 MG tablet  Take 10 mg by mouth every morning.     Marland Kitchen  PRESCRIPTION MEDICATION  Take 1 tablet by mouth 2 (two)  times daily. Oral Testosterone supplement (troche)      Allergies   Allergen  Reactions   .  Niaspan (Niacin (Antihyperlipidemic))      flushing   .  Penicillins  Other (See Comments)     Childhood allergy " does not remember"    Review of Systems  Respiratory: Positive for shortness of breath.  Cardiovascular: Positive for chest pain.  Psychiatric/Behavioral:  Panic attacks, anxiety  All other systems reviewed and are negative.   BP 123/71  Pulse 67  Resp 18  Ht 6\' 1"  (1.854 m)  Wt 275 lb (124.739 kg)  BMI 36.28 kg/m2  SpO2 96%  Physical Exam  Vitals reviewed.  Constitutional: He is oriented to person, place, and time. He appears well-developed and well-nourished. No distress.  HENT:  Head: Normocephalic and atraumatic.  Eyes: EOM are normal. Pupils are equal, round, and reactive to light.  Neck: Neck supple. No tracheal deviation present. No thyromegaly present.  Cardiovascular: Normal rate, regular rhythm, normal heart sounds and intact distal pulses. Exam reveals no gallop and  no friction rub.  No murmur heard.  Pulmonary/Chest: Breath sounds normal. He has no wheezes. He has no rales.  Musculoskeletal: Normal range of motion. He exhibits no edema.  Lymphadenopathy:  He has no cervical adenopathy.  Neurological: He is alert and oriented to person, place, and time. No cranial nerve deficit.  Skin: Skin is warm and dry.  Psychiatric: He has a normal mood and affect.   Diagnostic Tests:  CT Chest 06/06/2011  IMPRESSION:  1. The only finding of note is a 1.4 x 1.2 cm anterior mediastinal  soft tissue density. No surrounding hypervascularity, fatty  component, or internal calcification is noted. Differential  diagnostic considerations include an isolated reactive lymph node,  lymphoma, germ cell tumor, high density mediastinal cyst, or  ectopic thyroid/parathyroid tissue. Nuclear medicine PET CT may be  helpful in assessing for hypermetabolic activity associated with    neoplasm; alternatively, biopsy or careful CT or MRI imaging  observation may be utilized. Careful physical exam to assess the  patient for adenopathy at other sites (neck, groin, etc.) is also  recommended.  Impression:  46 year old male with a 1.4 cm anterior mediastinal mass. This in all likelihood represents a benign thymoma. The other possibilities the differential diagnoses include ectopic thyroid tissue, lymphoma (although this would be an extraordinarily unusual presentation), and germ cell tumor.  I reviewed the films with Preston Cole and also discussed the differential diagnosis.  I discussed her options. I do not think a PET scan would be particularly helpful in this setting. One option would be to follow him with serial CT scans. The second option would be to go ahead and remove this surgically for definitive diagnosis and treatment. Given the small size of lesion I think either option is reasonable. I did discuss with him the relative advantages and disadvantages of each option. I discussed with our surgical options including partial upper sternotomy or a right VATS approach. I given his size as well as the small size and location of the nodule, I think a right VATS approach would be best in his case. I discussed with them the indications, risks, benefits, and alternatives. He understands the risk of surgery include but are not limited to death, MI, DVT, PE, infection, bleeding, small possibility of need for a full thoracotomy or sternotomy for control of bleeding. We discussed expected hospital stay which be 2-3 days. With a likely return to work in 3-4 weeks.  He is leaning heavily towards proceeding with surgical resection of the lesion, but wishes take some time to think over his options. He will call and let us know what he decides to the next few days.    He has decided to proceed with resection. He is aware of risks and benefits.

## 2011-07-03 NOTE — Preoperative (Signed)
Beta Blockers   Reason not to administer Beta Blockers:Not Applicable 

## 2011-07-03 NOTE — Brief Op Note (Addendum)
07/03/2011  10:26 AM  PATIENT:  Preston Cole  46 y.o. male  PRE-OPERATIVE DIAGNOSIS:  ANTERIOR MEDIASTINAL MASS  POST-OPERATIVE DIAGNOSIS:  ANTERIOR MEDIASTINAL MASS  PROCEDURE:  Procedure(s) (LRB):  VIDEO ASSISTED THORACOSCOPY (Right) THYMECTOMY  SURGEON:  Surgeon(s) and Role:    * Loreli Slot, MD - Primary  PHYSICIAN ASSISTANT: Erin Barrett PA-C  ANESTHESIA:   general  EBL:  Total I/O In: 2500 [I.V.:2500] Out: 525 [Urine:375; Blood:150]  BLOOD ADMINISTERED:none  DRAINS: chest tube in right chest   LOCAL MEDICATIONS USED:  MARCAINE     SPECIMEN:  Source of Specimen:  Thymus  DISPOSITION OF SPECIMEN:  PATHOLOGY  COUNTS:  YES  TOURNIQUET:  * No tourniquets in log *  DICTATION: .Dragon Dictation  PLAN OF CARE: Admit to inpatient   PATIENT DISPOSITION:  PACU - hemodynamically stable.   Delay start of Pharmacological VTE agent (>24hrs) due to surgical blood loss or risk of bleeding: no  Mass ~ 1 cm soft, no invasion

## 2011-07-03 NOTE — Anesthesia Preprocedure Evaluation (Addendum)
Anesthesia Evaluation  Patient identified by MRN, date of birth, ID band Patient awake    Reviewed: Allergy & Precautions, H&P , NPO status , Patient's Chart, lab work & pertinent test results, reviewed documented beta blocker date and time   Airway Mallampati: II TM Distance: >3 FB Neck ROM: Full    Dental  (+) Teeth Intact and Dental Advisory Given   Pulmonary  Mediastinal mass breath sounds clear to auscultation  Pulmonary exam normal       Cardiovascular hypertension, Pt. on home beta blockers and Pt. on medications Rhythm:Regular Rate:Normal     Neuro/Psych Anxiety negative neurological ROS  negative psych ROS   GI/Hepatic Neg liver ROS, GERD-  Controlled,  Endo/Other  Morbid obesity  Renal/GU negative Renal ROS     Musculoskeletal   Abdominal (+) + obese,   Peds  Hematology   Anesthesia Other Findings   Reproductive/Obstetrics                          Anesthesia Physical Anesthesia Plan  ASA: III  Anesthesia Plan: General   Post-op Pain Management:    Induction: Intravenous  Airway Management Planned: Double Lumen EBT  Additional Equipment: Arterial line and CVP  Intra-op Plan:   Post-operative Plan: Extubation in OR  Informed Consent: I have reviewed the patients History and Physical, chart, labs and discussed the procedure including the risks, benefits and alternatives for the proposed anesthesia with the patient or authorized representative who has indicated his/her understanding and acceptance.   Dental advisory given  Plan Discussed with: Surgeon and CRNA  Anesthesia Plan Comments: (Plan routine monitors, A line, CVP, GETA with double lumen ETT  )       Anesthesia Quick Evaluation

## 2011-07-03 NOTE — Progress Notes (Signed)
Dr. Dorris Fetch notified that pt's CXR done 06/29/11 instead of Friday. He stated that it was ok and not to redo CXR.//L. Mehmet Scally,RN

## 2011-07-03 NOTE — Progress Notes (Signed)
Dr. Dorris Fetch by to evaluate patient; pt may go to 2000 unit; stated that CXR was seen; will order Toradol as well for pain control.

## 2011-07-03 NOTE — Anesthesia Procedure Notes (Addendum)
Anesthesia Regional Block:   Narrative:    Procedure Name: Intubation Date/Time: 07/03/2011 7:39 AM Performed by: Jerilee Hoh Pre-anesthesia Checklist: Patient identified, Emergency Drugs available, Suction available and Patient being monitored Patient Re-evaluated:Patient Re-evaluated prior to inductionOxygen Delivery Method: Circle system utilized Preoxygenation: Pre-oxygenation with 100% oxygen Intubation Type: IV induction Ventilation: Oral airway inserted - appropriate to patient size Laryngoscope Size: Mac and 4 Grade View: Grade I Endobronchial tube: Left, Double lumen EBT and EBT position confirmed by fiberoptic bronchoscope and 39 Fr Number of attempts: 1 Airway Equipment and Method: Fiberoptic brochoscope Placement Confirmation: positive ETCO2,  CO2 detector and breath sounds checked- equal and bilateral Tube secured with: Tape Dental Injury: Teeth and Oropharynx as per pre-operative assessment

## 2011-07-03 NOTE — Interval H&P Note (Signed)
History and Physical Interval Note:  07/03/2011 7:21 AM  Preston Cole  has presented today for surgery, with the diagnosis of ANTERIOR MEDIASTINAL MASS  The various methods of treatment have been discussed with the patient and family. After consideration of risks, benefits and other options for treatment, the patient has consented to  Procedure(s) (LRB): VIDEO ASSISTED THORACOSCOPY (Right) as a surgical intervention .  The patients' history has been reviewed, patient examined, no change in status, stable for surgery.  I have reviewed the patients' chart and labs.  Questions were answered to the patient's satisfaction.     Rhet Rorke C

## 2011-07-03 NOTE — Anesthesia Postprocedure Evaluation (Signed)
  Anesthesia Post-op Note  Patient: Preston Cole  Procedure(s) Performed: Procedure(s) (LRB): VIDEO ASSISTED THORACOSCOPY (Right)  Patient Location: PACU  Anesthesia Type: General  Level of Consciousness: awake, alert  and oriented  Airway and Oxygen Therapy: Patient Spontanous Breathing and Patient connected to nasal cannula oxygen  Post-op Pain: mild  Post-op Assessment: Post-op Vital signs reviewed, Patient's Cardiovascular Status Stable, Respiratory Function Stable, Patent Airway, No signs of Nausea or vomiting and Pain level controlled  Post-op Vital Signs: Reviewed and stable  Complications: No apparent anesthesia complications

## 2011-07-04 ENCOUNTER — Encounter (HOSPITAL_COMMUNITY): Payer: Self-pay | Admitting: Thoracic Surgery (Cardiothoracic Vascular Surgery)

## 2011-07-04 ENCOUNTER — Inpatient Hospital Stay (HOSPITAL_COMMUNITY): Payer: BC Managed Care – PPO

## 2011-07-04 LAB — CBC
HCT: 42 % (ref 39.0–52.0)
MCH: 28.6 pg (ref 26.0–34.0)
MCHC: 33.1 g/dL (ref 30.0–36.0)
RDW: 13.5 % (ref 11.5–15.5)

## 2011-07-04 LAB — BLOOD GAS, ARTERIAL
Acid-Base Excess: 1.7 mmol/L (ref 0.0–2.0)
Bicarbonate: 26.5 mEq/L — ABNORMAL HIGH (ref 20.0–24.0)
O2 Saturation: 95.7 %
Patient temperature: 98.6

## 2011-07-04 LAB — BASIC METABOLIC PANEL
Calcium: 8.2 mg/dL — ABNORMAL LOW (ref 8.4–10.5)
GFR calc non Af Amer: 79 mL/min — ABNORMAL LOW (ref >90)

## 2011-07-04 MED ORDER — LACTATED RINGERS IV SOLN: INTRAVENOUS | Status: DC

## 2011-07-04 MED ORDER — LEVALBUTEROL HCL 0.63 MG/3ML IN NEBU
0.6300 mg | INHALATION_SOLUTION | Freq: Three times a day (TID) | RESPIRATORY_TRACT | Status: DC
  Administered 2011-07-05 – 2011-07-06 (×4): 0.63 mg via RESPIRATORY_TRACT
  Filled 2011-07-04 (×7): qty 3

## 2011-07-04 MED ORDER — LORAZEPAM 0.5 MG PO TABS
0.5000 mg | ORAL_TABLET | Freq: Three times a day (TID) | ORAL | Status: DC | PRN
  Administered 2011-07-04 – 2011-07-05 (×3): 0.5 mg via ORAL
  Filled 2011-07-04 (×3): qty 1

## 2011-07-04 MED ORDER — KETOROLAC TROMETHAMINE 30 MG/ML IJ SOLN
30.0000 mg | Freq: Once | INTRAMUSCULAR | Status: AC
  Administered 2011-07-04: 30 mg via INTRAVENOUS
  Filled 2011-07-04: qty 1

## 2011-07-04 MED ORDER — LEVALBUTEROL HCL 0.63 MG/3ML IN NEBU
0.6300 mg | INHALATION_SOLUTION | Freq: Four times a day (QID) | RESPIRATORY_TRACT | Status: DC | PRN
  Filled 2011-07-04: qty 3

## 2011-07-04 MED ORDER — ACETAMINOPHEN 325 MG PO TABS
650.0000 mg | ORAL_TABLET | ORAL | Status: DC | PRN
  Administered 2011-07-04: 650 mg via ORAL
  Filled 2011-07-04: qty 2

## 2011-07-04 MED FILL — Hydromorphone HCl Inj 1 MG/ML: INTRAMUSCULAR | Qty: 1 | Status: AC

## 2011-07-04 NOTE — Progress Notes (Signed)
   CARE MANAGEMENT NOTE 07/04/2011  Patient:  FAVIAN, KITTLESON   Account Number:  [id number]  Date Initiated:  07/04/2011  Documentation initiated by:  [facility]  Subjective/Objective Assessment:   Post op VATS ON 07/03/11.  Has spouse.     Action/Plan:   PTA, PT INDEPENDENT, LIVES WITH SPOUSE.  WIFE TO PROVIDE CARE AT DISCHARGE, PER PT.  WILL FOLLOW FOR HOME NEEDS AS PT PROGRESSES.   Anticipated DC Date:  07/07/2011   Anticipated DC Plan:  HOME/SELF CARE      DC Planning Services  CM consult      Choice offered to / List presented to:             Status of service:  In process, will continue to follow Medicare Important Message given?   (If response is "NO", the following Medicare IM given date fields will be blank) Date Medicare IM given:   Date Additional Medicare IM given:    Discharge Disposition:    Per UR Regulation:  Reviewed for med. necessity/level of care/duration of stay  If discussed at Long Length of Stay Meetings, dates discussed:    Comments:    Jerrell Belfast, RN, BSN Phone #[phone]

## 2011-07-04 NOTE — Progress Notes (Signed)
Foley dc per MD order per protocol. Pt tolerated procedure. 500cc clear urine flushed in toilet.  400 cc voided post foley cath removal.Pt's dressing around Ct sutures and On-Q sites, dressings changed. Will continue to monitor.    Fifi Schindler,Rn,BSN

## 2011-07-04 NOTE — Progress Notes (Signed)
[address]            [place] [address]          [phone]     1 Day Post-Op  Procedure(s) (LRB): VIDEO ASSISTED THORACOSCOPY (Right) Subjective: Some usual surgical discomfort  Objective  Telemetry SR/SB  Temp:  [97.9 F (36.6 C)-98.9 F (37.2 C)] 98.9 F (37.2 C) (04/30 0424) Pulse Rate:  [60-84] 60  (04/30 0424) Resp:  [12-28] 17  (04/30 0424) BP: (105-173)/(59-86) 105/59 mmHg (04/30 0424) SpO2:  [91 %-97 %] 94 % (04/30 0424) Arterial Line BP: (132-185)/(56-67) 132/56 mmHg (04/29 1230) Weight:  [283 lb 1.1 oz (128.4 kg)] 283 lb 1.1 oz (128.4 kg) (04/29 1345)   Intake/Output Summary (Last 24 hours) at 07/04/11 0759 Last data filed at 07/04/11 0700  Gross per 24 hour  Intake   2860 ml  Output   1345 ml  Net   1515 ml       General appearance: alert, cooperative and no distress Heart: regular rate and rhythm and S1, S2 normal Lungs: diminished R>L base Abdomen: soft, nontender, + BS Extremities: no edema Wound: incisions-dressing bloody  Lab Results:  Basename 07/04/11 0505  NA 137  K 3.5  CL 101  CO2 29  GLUCOSE 106*  BUN 14  CREATININE 1.10  CALCIUM 8.2*  MG --  PHOS --   No results found for this basename: AST:2,ALT:2,ALKPHOS:2,BILITOT:2,PROT:2,ALBUMIN:2 in the last 72 hours No results found for this basename: LIPASE:2,AMYLASE:2 in the last 72 hours  Basename 07/04/11 0505  WBC 8.6  NEUTROABS --  HGB 13.9  HCT 42.0  MCV 86.4  PLT 138*   No results found for this basename: CKTOTAL:4,CKMB:4,TROPONINI:4 in the last 72 hours No components found with this basename: POCBNP:3 No results found for this basename: DDIMER in the last 72 hours No results found for this basename: HGBA1C in the last 72 hours No results found for this basename: CHOL,HDL,LDLCALC,TRIG,CHOLHDL in the last 72 hours No results found for this basename: TSH,T4TOTAL,FREET3,T3FREE,THYROIDAB in the last 72 hours No results found for this  basename: VITAMINB12,FOLATE,FERRITIN,TIBC,IRON,RETICCTPCT in the last 72 hours  Medications: Scheduled    . acetaminophen  1,000 mg Intravenous Q6H  . bisacodyl  10 mg Oral Daily  . enoxaparin (LOVENOX) injection  40 mg Subcutaneous Q12H  . fentaNYL   Intravenous Q4H  . ketorolac  30 mg Intravenous Q6H  . levalbuterol  0.63 mg Nebulization Q6H  . lisinopril  10 mg Oral Daily  . metoprolol succinate  25 mg Oral Daily  . pantoprazole  80 mg Oral Q1200  . PARoxetine  10 mg Oral Daily  . vancomycin  1,000 mg Intravenous Q12H     Radiology/Studies:  Dg Chest Portable 1 View  07/03/2011  *RADIOLOGY REPORT*  Clinical Data: Postop  PORTABLE CHEST - 1 VIEW  Comparison: 06/29/2011  Findings: Cardiomediastinal silhouette is stable.  No convincing pulmonary edema.  Right IJ central line with tip in SVC.  Surgical clips are noted in mid mediastinum.  There is a mediastinal drain. Poor inspiration with bilateral basilar hazy atelectasis or infiltrate.  Hazy atelectasis left perihilar. No diagnostic pneumothorax.  IMPRESSION:   No convincing pulmonary edema.  Right IJ central line with tip in SVC.  Surgical clips are noted in mid mediastinum.  There is a mediastinal drain.  Poor inspiration with bilateral basilar hazy atelectasis or infiltrate.  Hazy  atelectasis left perihilar.  Original Report Authenticated By: Natasha Mead, M.D.   Chest tube -320 cc out yesterday, no air leak  INR: Will add last result for INR, ABG once components are confirmed Will add last 4 CBG results once components are confirmed  Assessment/Plan: S/P Procedure(s) (LRB): VIDEO ASSISTED THORACOSCOPY (Right)  1. Check cxr this am, poss d/c tube later today 2 will re-order ativan 3 labs ok 4 pulm toilet 5 D/C foley 6 advance diet per protocol 7 cont pca 8 decrease ivf On lovenox/ .pas hose  LOS: 1 day    Worley Radermacher E 4/30/20137:59 AM

## 2011-07-04 NOTE — Progress Notes (Signed)
1 Day Post-Op Procedure(s) (LRB): VIDEO ASSISTED THORACOSCOPY (Right) Subjective: Some discomfort, well controlled No nausea  Objective: Vital signs in last 24 hours: Temp:  [97.9 F (36.6 C)-98.9 F (37.2 C)] 98.2 F (36.8 C) (04/30 0805) Pulse Rate:  [60-84] 65  (04/30 0805) Cardiac Rhythm:  [-] Normal sinus rhythm (04/29 1930) Resp:  [12-28] 18  (04/30 0805) BP: (105-173)/(59-86) 115/66 mmHg (04/30 0805) SpO2:  [91 %-98 %] 98 % (04/30 0805) Arterial Line BP: (132-185)/(56-67) 132/56 mmHg (04/29 1230) Weight:  [283 lb 1.1 oz (128.4 kg)] 283 lb 1.1 oz (128.4 kg) (04/29 1345)  Hemodynamic parameters for last 24 hours:    Intake/Output from previous day: 04/29 0701 - 04/30 0700 In: 2860 [P.O.:360; I.V.:2500] Out: 1345 [Urine:875; Blood:150; Chest Tube:320] Intake/Output this shift:    General appearance: alert and no distress Neurologic: intact Heart: regular rate and rhythm Lungs: clear to auscultation bilaterally Abdomen: normal findings: soft, non-tender  Lab Results:  Preston Cole 07/04/11 0505  WBC 8.6  HGB 13.9  HCT 42.0  PLT 138*   BMET:  Basename 07/04/11 0505  NA 137  K 3.5  CL 101  CO2 29  GLUCOSE 106*  BUN 14  CREATININE 1.10  CALCIUM 8.2*    PT/INR: No results found for this basename: LABPROT,INR in the last 72 hours ABG    Component Value Date/Time   PHART 7.364 07/04/2011 0308   HCO3 26.5* 07/04/2011 0308   TCO2 27.9 07/04/2011 0308   O2SAT 95.7 07/04/2011 0308   CBG (last 3)  No results found for this basename: GLUCAP:3 in the last 72 hours  Assessment/Plan: S/P Procedure(s) (LRB): VIDEO ASSISTED THORACOSCOPY (Right) POD # 1 R VATS thymectomy Doing well Tolerating PO- IV to KVO Ambulate Will keep CT today as drainage is moderate PAS and lovenox for DVT prophylaxis   LOS: 1 day    Preston Cole C 07/04/2011

## 2011-07-04 NOTE — Progress Notes (Signed)
UR Completed.  Sarely Stracener Jane [phone] 07/04/2011  

## 2011-07-05 ENCOUNTER — Inpatient Hospital Stay (HOSPITAL_COMMUNITY): Payer: BC Managed Care – PPO

## 2011-07-05 LAB — CBC
Platelets: 138 10*3/uL — ABNORMAL LOW (ref 150–400)
RBC: 4.74 MIL/uL (ref 4.22–5.81)
WBC: 7.4 10*3/uL (ref 4.0–10.5)

## 2011-07-05 LAB — COMPREHENSIVE METABOLIC PANEL
ALT: 24 U/L (ref 0–53)
AST: 18 U/L (ref 0–37)
CO2: 28 mEq/L (ref 19–32)
Calcium: 8.5 mg/dL (ref 8.4–10.5)
Chloride: 103 mEq/L (ref 96–112)
GFR calc non Af Amer: 82 mL/min — ABNORMAL LOW (ref >90)
Potassium: 3.7 mEq/L (ref 3.5–5.1)
Sodium: 137 mEq/L (ref 135–145)
Total Bilirubin: 1.7 mg/dL — ABNORMAL HIGH (ref 0.3–1.2)

## 2011-07-05 MED ORDER — OXYCODONE-ACETAMINOPHEN 5-325 MG PO TABS: 1.0000 | ORAL_TABLET | ORAL | Status: AC | PRN

## 2011-07-05 NOTE — Discharge Summary (Signed)
Physician Discharge Summary  Patient ID: Preston Cole MRN: [id number] DOB/AGE: 03-13-1965 46 y.o.  Admit date: 07/03/2011 Discharge date: 07/05/2011  Admission Diagnoses:  1. Mediastinal Mass 2. Anxiety 3. Panic Attacks 4. Hypertension  Discharge Diagnoses:  1. Mediastinal Mass 2. Anxiety 3. Panic Attacks 4. Hypertension 5. S/P Thoracotomy 5. Bronchiogenic cyst  Discharged Condition: good  History of Present Illness:   Preston Cole is a 46 yo white male with known history of HTN, Anxiety, and Panic Attacks who presented to the [facility] Emergency Department with a complaint of chest pain and shortness of breath.  The patient states the onset of pain occurred after eating.  Workup in the ED included a CTA of the chest to assess his coronary arteries, which were found to be normal.  However, incidentally he was found to have 1.4cm anterior mass.  He was instructed to follow up with Dr. Clifton Custard who ordered a formal CT scan of the chest which again demonstrated the presence of 1.4cm Mediastinal mass.  The lesion was well circumscribed and surrounded by fatty tissue.  The patient was referred to [facility] for surgical evaluation.  The patient was evaluated by Dr. Dorris Fetch on 06/22/2011 at which time Dr. Dorris Fetch discussed possible diagnosis and treatment options.  The risks and benefits of all procedures were discussed with the patient and his wife and he wished to proceed with surgical resection of the mass.  He was set up for an outpatient elective procedure scheduled for 07/03/2011.  Hospital Course:   Preston Cole presented to [facility] on 07/03/2011 to undergo a right sided VATS with Thymectomy.  The patient tolerated the procedure well and was extubated prior to being taken to the PACU.  POD #1 the patients foley catheter was removed.  POD #2 the patients chest tubes were removed without difficulty.  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   POD #3 the patients chest xray does not reveal any evidence of pneumothorax, but does reveal a small left pleural effusion and some atelectasis.  He is doing well and is stable for discharge.  Disposition: 01-Home or Self Care   Medication List  As of 07/05/2011  8:36 AM   TAKE these medications         lisinopril 10 MG tablet   Commonly known as: PRINIVIL,ZESTRIL   Take 10 mg by mouth daily.      LORazepam 0.5 MG tablet   Commonly known as: ATIVAN   Take 0.5 mg by mouth every 8 (eight) hours as needed. For anxiety      metoprolol succinate 50 MG 24 hr tablet   Commonly known as: TOPROL-XL   Take 25 mg by mouth daily. Take with or immediately following a meal.      omeprazole 40 MG capsule   Commonly known as: PRILOSEC   Take 40 mg by mouth daily.      oxyCODONE-acetaminophen 5-325 MG per tablet   Commonly known as: PERCOCET   Take 1-2 tablets by mouth every 4 (four) hours as needed.      PARoxetine 10 MG tablet   Commonly known as: PAXIL   Take 10-20 mg by mouth every morning.      PRESCRIPTION MEDICATION   Take 1 tablet by mouth 2 (two) times daily. Oral Testosterone supplement (troche)             Signed: Lowella Dandy 07/05/2011, 8:36 AM

## 2011-07-05 NOTE — Discharge Instructions (Signed)
Thoracotomy Care After Refer to this sheet in the next few weeks. These instructions provide you with information on caring for yourself after your procedure. Your caregiver may also give you more specific instructions. Your treatment has been planned according to current medical practices, but problems sometimes occur. Call your caregiver if you have any problems or questions after your procedure. HOME CARE INSTRUCTIONS  Remove the bandage (dressing) over your chest tube site as directed by your caregiver.   It is normal to be sore for a couple weeks following surgery. See your caregiver if this seems to be getting worse rather than better.   Only take over-the-counter or prescription medicines for pain, discomfort, or fever as directed by your caregiver. It is very important to take pain medicine when you need it so that you will cough and breathe deeply enough to clear mucus (phlegm) and expand your lungs. This helps prevent a lung infection (pneumonia).   If it hurts to cough, hold a pillow against your chest when you cough. This may help with the discomfort. In spite of the discomfort, cough frequently.   Taking deep breaths keeps lungs inflated and protects against pneumonia. Most patients will go home with a device called an incentive spirometer that encourages deep breathing.   You may resume a normal diet and activities as directed.   Use showers for bathing until you see your caregiver, or as instructed.   Change dressings if necessary or as directed.   Avoid lifting or driving until you are instructed otherwise.   Make an appointment to see your caregiver for stitch (suture) or staple removal when instructed.   Do not travel by airplane for 2 weeks after the chest tube is removed.  SEEK MEDICAL CARE IF:  You are bleeding from your wounds.   Your heartbeat seems irregular.   You have redness, swelling, or increasing pain in the wounds.   There is pus coming from your  wounds.   There is a bad smell coming from the wound or dressing.  SEEK IMMEDIATE MEDICAL CARE IF:  You have a fever.   You develop a rash.   You have difficulty breathing.   You develop any reaction or side effects to medicines given.   You develop lightheadedness or feel faint.   You develop shortness of breath or chest pain.  MAKE SURE YOU:  Understand these instructions.   Will watch your condition.   Will get help right away if you are not doing well or get worse.  Document Released: 08/05/2010 Document Revised: 02/09/2011 Document Reviewed: 08/05/2010 Exit[employer] Patient Information 2012 [employer]. 

## 2011-07-05 NOTE — Progress Notes (Addendum)
2 Days Post-Op Procedure(s) (LRB): VIDEO ASSISTED THORACOSCOPY (Right) Subjective:  Mr. Lyerly complains of productive cough this morning.  He states he is coughing up green stuff.  He also complains of incisional pain  Objective: Vital signs in last 24 hours: Temp:  [97.9 F (36.6 C)-100.4 F (38 C)] 97.9 F (36.6 C) (05/01 0458) Pulse Rate:  [65-87] 87  (05/01 0458) Cardiac Rhythm:  [-] Normal sinus rhythm (04/30 2000) Resp:  [18-19] 19  (05/01 0745) BP: (110-138)/(64-70) 110/66 mmHg (05/01 0458) SpO2:  [93 %-98 %] 98 % (05/01 0745)  Intake/Output from previous day: 04/30 0701 - 05/01 0700 In: 1080 [P.O.:1080] Out: 1200 [Urine:1200] Intake/Output this shift: Total I/O In: 240 [P.O.:240] Out: 400 [Urine:400]  General appearance: alert, cooperative and no distress Heart: regular rate and rhythm Lungs: clear to auscultation bilaterally Abdomen: soft, non-tender; bowel sounds normal; no masses,  no organomegaly Extremities: edema trace Wound: clean and dry  Lab Results:  Basename 07/05/11 0500 07/04/11 0505  WBC 7.4 8.6  HGB 13.7 13.9  HCT 40.7 42.0  PLT 138* 138*   BMET:  Basename 07/05/11 0500 07/04/11 0505  NA 137 137  K 3.7 3.5  CL 103 101  CO2 28 29  GLUCOSE 96 106*  BUN 11 14  CREATININE 1.07 1.10  CALCIUM 8.5 8.2*    PT/INR: No results found for this basename: LABPROT,INR in the last 72 hours ABG    Component Value Date/Time   PHART 7.364 07/04/2011 0308   HCO3 26.5* 07/04/2011 0308   TCO2 27.9 07/04/2011 0308   O2SAT 95.7 07/04/2011 0308   CBG (last 3)  No results found for this basename: GLUCAP:3 in the last 72 hours  Assessment/Plan: S/P Procedure(s) (LRB): VIDEO ASSISTED THORACOSCOPY (Right)  2. CV- NSR rate and pressure controlled, will continue home Lisinopril 3. Resp- dyspnea, will continue nebulizers prn, aggressive IS 4. Chest tube- output of 160cc for past 24 hours- will likely d/c tube today 5. Will advance to regular diet 6. Dispo-  if chest tube removed today potential d/c home in next 24-48 hours   LOS: 2 days    BARRETT, ERIN 07/05/2011   D/c chest tube, d/c on-Q  D/C PCA in AM  Possibly home tomorrow

## 2011-07-06 ENCOUNTER — Inpatient Hospital Stay (HOSPITAL_COMMUNITY): Payer: BC Managed Care – PPO

## 2011-07-06 MED ORDER — TRAMADOL HCL 50 MG PO TABS: 50.0000 mg | ORAL_TABLET | Freq: Four times a day (QID) | ORAL | Status: AC | PRN

## 2011-07-06 MED ORDER — TRAMADOL HCL 50 MG PO TABS: 50.0000 mg | ORAL_TABLET | Freq: Four times a day (QID) | ORAL | Status: DC | PRN

## 2011-07-06 NOTE — Progress Notes (Signed)
Pt discharge instructions and patient education complete. IV site d/c. Site WNL. R IJ d/c. Site WNL. CT sutures removed per MD order. Pt tolerated well. Pt and family had no further questions. D/C home with wife via wheelchair. Dion Saucier

## 2011-07-06 NOTE — Progress Notes (Signed)
R IJ removed. Pt tolerated well. Pressure held for 5 minutes. Pressure dressing applied. Will continue to monitor. Dion Saucier

## 2011-07-06 NOTE — Progress Notes (Signed)
3 Days Post-Op Procedure(s) (LRB): VIDEO ASSISTED THORACOSCOPY (Right) Subjective:  Mr. Runco has no complaints this morning.  He states he is ready to go home today.  Objective: Vital signs in last 24 hours: Temp:  [98.3 F (36.8 C)-99.1 F (37.3 C)] 98.3 F (36.8 C) (05/02 0413) Pulse Rate:  [65-78] 76  (05/02 0413) Cardiac Rhythm:  [-] Normal sinus rhythm (05/01 2000) Resp:  [18-20] 18  (05/02 0413) BP: (119-136)/(61-74) 132/68 mmHg (05/02 0413) SpO2:  [93 %-99 %] 94 % (05/02 0413)  Intake/Output from previous day: 05/01 0701 - 05/02 0700 In: 840 [P.O.:840] Out: 1500 [Urine:1500]  General appearance: alert, cooperative and no distress Neurologic: intact Heart: regular rate and rhythm Lungs: clear to auscultation bilaterally Abdomen: soft, non-tender; bowel sounds normal; no masses,  no organomegaly Extremities: edema trace Wound: clean and dry  Lab Results:  Basename 07/05/11 0500 07/04/11 0505  WBC 7.4 8.6  HGB 13.7 13.9  HCT 40.7 42.0  PLT 138* 138*   BMET:  Basename 07/05/11 0500 07/04/11 0505  NA 137 137  K 3.7 3.5  CL 103 101  CO2 28 29  GLUCOSE 96 106*  BUN 11 14  CREATININE 1.07 1.10  CALCIUM 8.5 8.2*    PT/INR: No results found for this basename: LABPROT,INR in the last 72 hours ABG    Component Value Date/Time   PHART 7.364 07/04/2011 0308   HCO3 26.5* 07/04/2011 0308   TCO2 27.9 07/04/2011 0308   O2SAT 95.7 07/04/2011 0308   CBG (last 3)  No results found for this basename: GLUCAP:3 in the last 72 hours  Assessment/Plan: S/P Procedure(s) (LRB): VIDEO ASSISTED THORACOSCOPY (Right)  2. CV- NSR, rate and pressure controlled, on home dose of Lisinopril and Metoprolol 3. Resp- chest tube removed yesterday, no evidence of pneumothorax, + atelectasis will continue IS 4. Dispo- will d/c PCA, On-Q Pain ball, will d/c home today   LOS: 3 days    Janera Peugh 07/06/2011

## 2011-07-20 ENCOUNTER — Other Ambulatory Visit: Payer: Self-pay | Admitting: Thoracic Surgery (Cardiothoracic Vascular Surgery)

## 2011-07-20 DIAGNOSIS — R222 Localized swelling, mass and lump, trunk: Secondary | ICD-10-CM

## 2011-07-25 ENCOUNTER — Encounter: Payer: Self-pay | Admitting: Thoracic Surgery (Cardiothoracic Vascular Surgery)

## 2011-07-25 ENCOUNTER — Ambulatory Visit
Admission: RE | Admit: 2011-07-25 | Discharge: 2011-07-25 | Disposition: A | Discharge status: Home / Self Care | Payer: BC Managed Care – PPO | Source: Ambulatory Visit | Attending: Thoracic Surgery (Cardiothoracic Vascular Surgery) | Admitting: Thoracic Surgery (Cardiothoracic Vascular Surgery)

## 2011-07-25 ENCOUNTER — Ambulatory Visit (INDEPENDENT_AMBULATORY_CARE_PROVIDER_SITE_OTHER): Payer: Self-pay | Admitting: Thoracic Surgery (Cardiothoracic Vascular Surgery)

## 2011-07-25 VITALS — BP 127/84 | HR 67 | Resp 16 | Ht 73.0 in | Wt 270.0 lb

## 2011-07-25 DIAGNOSIS — R222 Localized swelling, mass and lump, trunk: Secondary | ICD-10-CM

## 2011-07-25 DIAGNOSIS — D150 Benign neoplasm of thymus: Secondary | ICD-10-CM

## 2011-07-25 DIAGNOSIS — Z09 Encounter for follow-up examination after completed treatment for conditions other than malignant neoplasm: Secondary | ICD-10-CM

## 2011-07-25 NOTE — Progress Notes (Signed)
  HPI:  Mr. Faries returns for a scheduled postoperative followup visit. 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 His postoperative course was uncomplicated. He says he does have a little bit of pain, but overall is doing very well. He has returned to work and has been driving without difficulty. He is anxious to resume full activities.  Past Medical History  Diagnosis Date  . Hypertension     takes medications daily  . Panic attacks     paxil, ativan taken  . Pneumonia     hx of 2011  . GERD (gastroesophageal reflux disease)     prilosec  . Seasonal allergic conjunctivitis       Current Outpatient Prescriptions  Medication Sig Dispense Refill  . lisinopril (PRINIVIL,ZESTRIL) 10 MG tablet Take 10 mg by mouth daily.      Marland Kitchen LORazepam (ATIVAN) 0.5 MG tablet Take 0.5 mg by mouth every 8 (eight) hours as needed. For anxiety      . metoprolol succinate (TOPROL-XL) 50 MG 24 hr tablet Take 25 mg by mouth daily. Take with or immediately following a meal.      . omeprazole (PRILOSEC) 40 MG capsule Take 40 mg by mouth daily.      Marland Kitchen PARoxetine (PAXIL) 10 MG tablet Take 10-20 mg by mouth every morning.       Marland Kitchen PRESCRIPTION MEDICATION Take 1 tablet by mouth 2 (two) times daily. Oral Testosterone supplement (troche)      . oxyCODONE-acetaminophen (PERCOCET) 5-325 MG per tablet       . 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 He's doing very well at this point in time. There are no restrictions on his activities. As this was a benign cyst no followup is  necessary.   Plan:  I will be happy to see Mr. Enyeart back at any time the future if I can be of any further assistance with his care.

## 2012-04-17 ENCOUNTER — Other Ambulatory Visit (HOSPITAL_COMMUNITY): Payer: Self-pay | Admitting: Internal Medicine

## 2012-05-17 ENCOUNTER — Other Ambulatory Visit: Payer: Self-pay | Admitting: Family Medicine

## 2012-05-17 ENCOUNTER — Other Ambulatory Visit: Payer: Self-pay | Admitting: Internal Medicine

## 2012-05-22 ENCOUNTER — Ambulatory Visit
Admission: RE | Admit: 2012-05-22 | Discharge: 2012-05-22 | Disposition: A | Discharge status: Home / Self Care | Payer: BC Managed Care – PPO | Source: Ambulatory Visit | Attending: Internal Medicine | Admitting: Internal Medicine

## 2012-05-22 DIAGNOSIS — R911 Solitary pulmonary nodule: Secondary | ICD-10-CM

## 2012-05-22 MED ORDER — IOHEXOL 300 MG/ML  SOLN
100.0000 mL | Freq: Once | INTRAMUSCULAR | Status: AC | PRN
  Administered 2012-05-22: 100 mL via INTRAVENOUS

## 2012-06-23 IMAGING — CR DG CHEST 1V PORT
2 series · 2 of 2 positions shown · non-contrast
Comparison: Portable exam 5433 hours compared to 07/04/2011

CLINICAL DATA: Chest tube, pleural effusion

PORTABLE CHEST - 1 VIEW

[AP (1 of 2)]
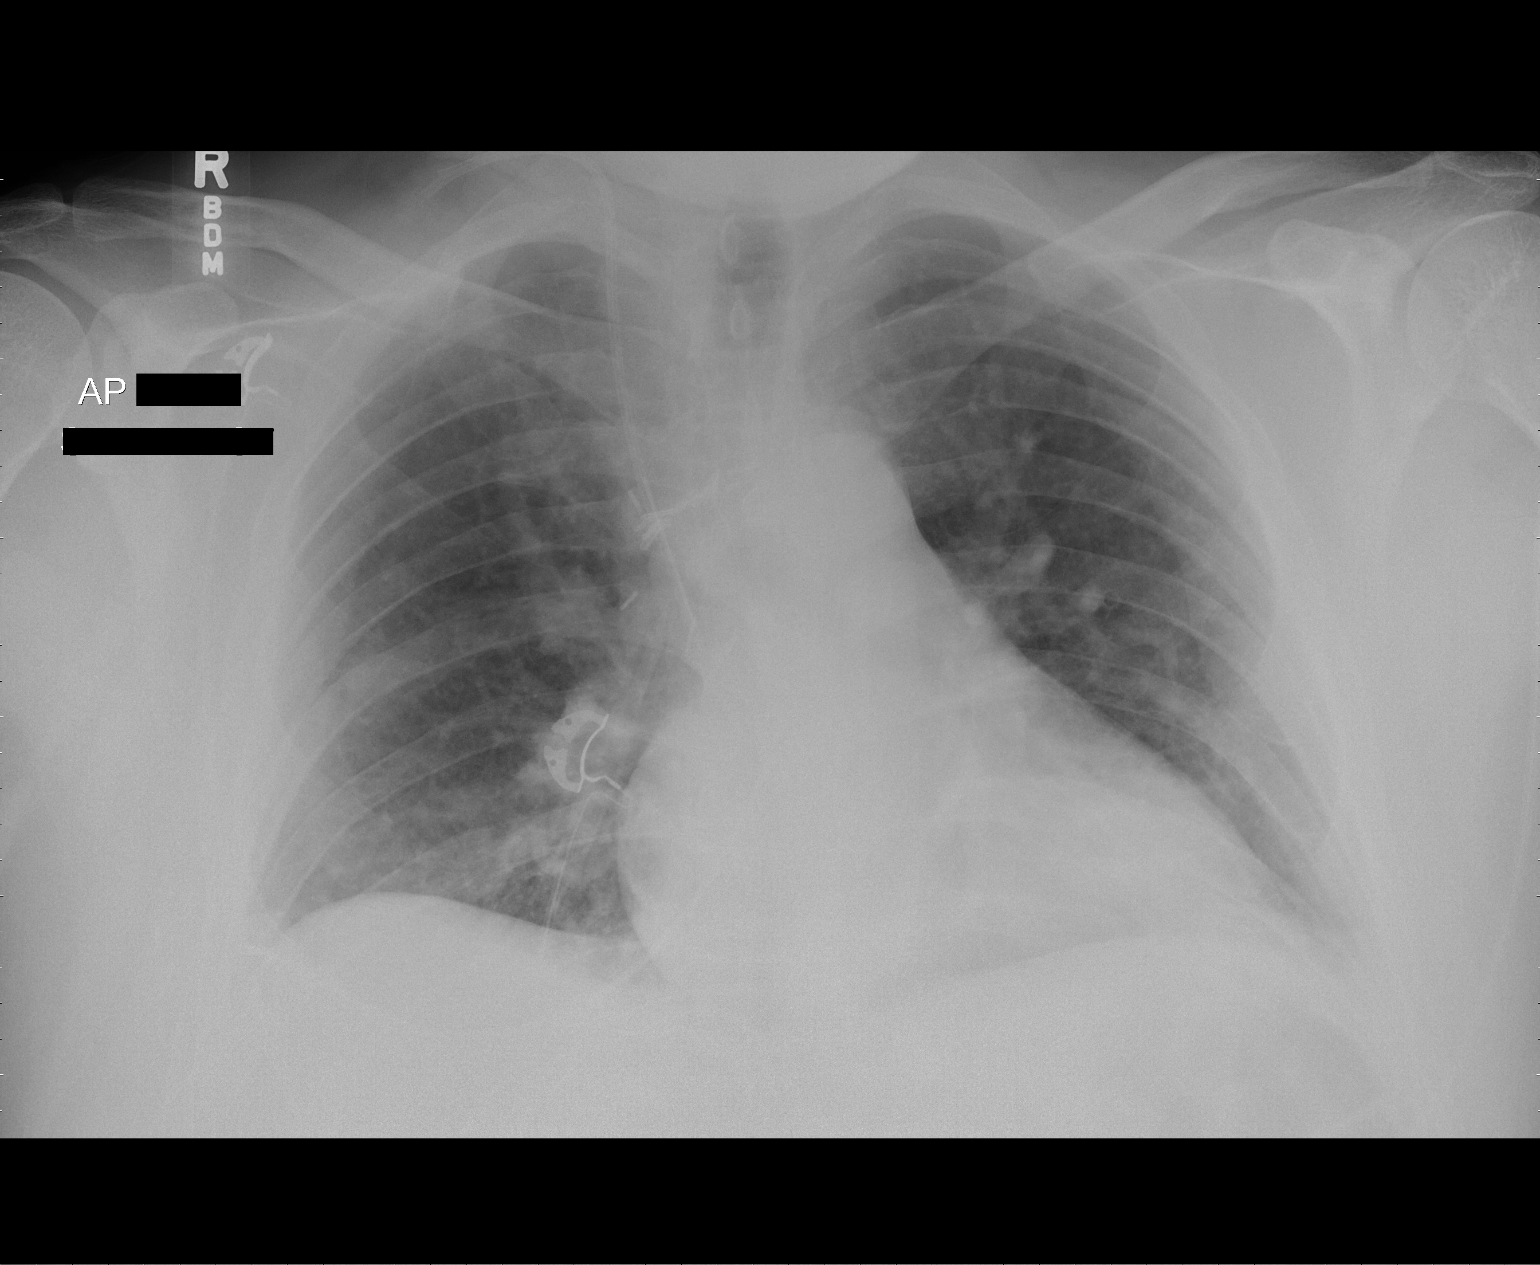

[AP (2 of 2)]
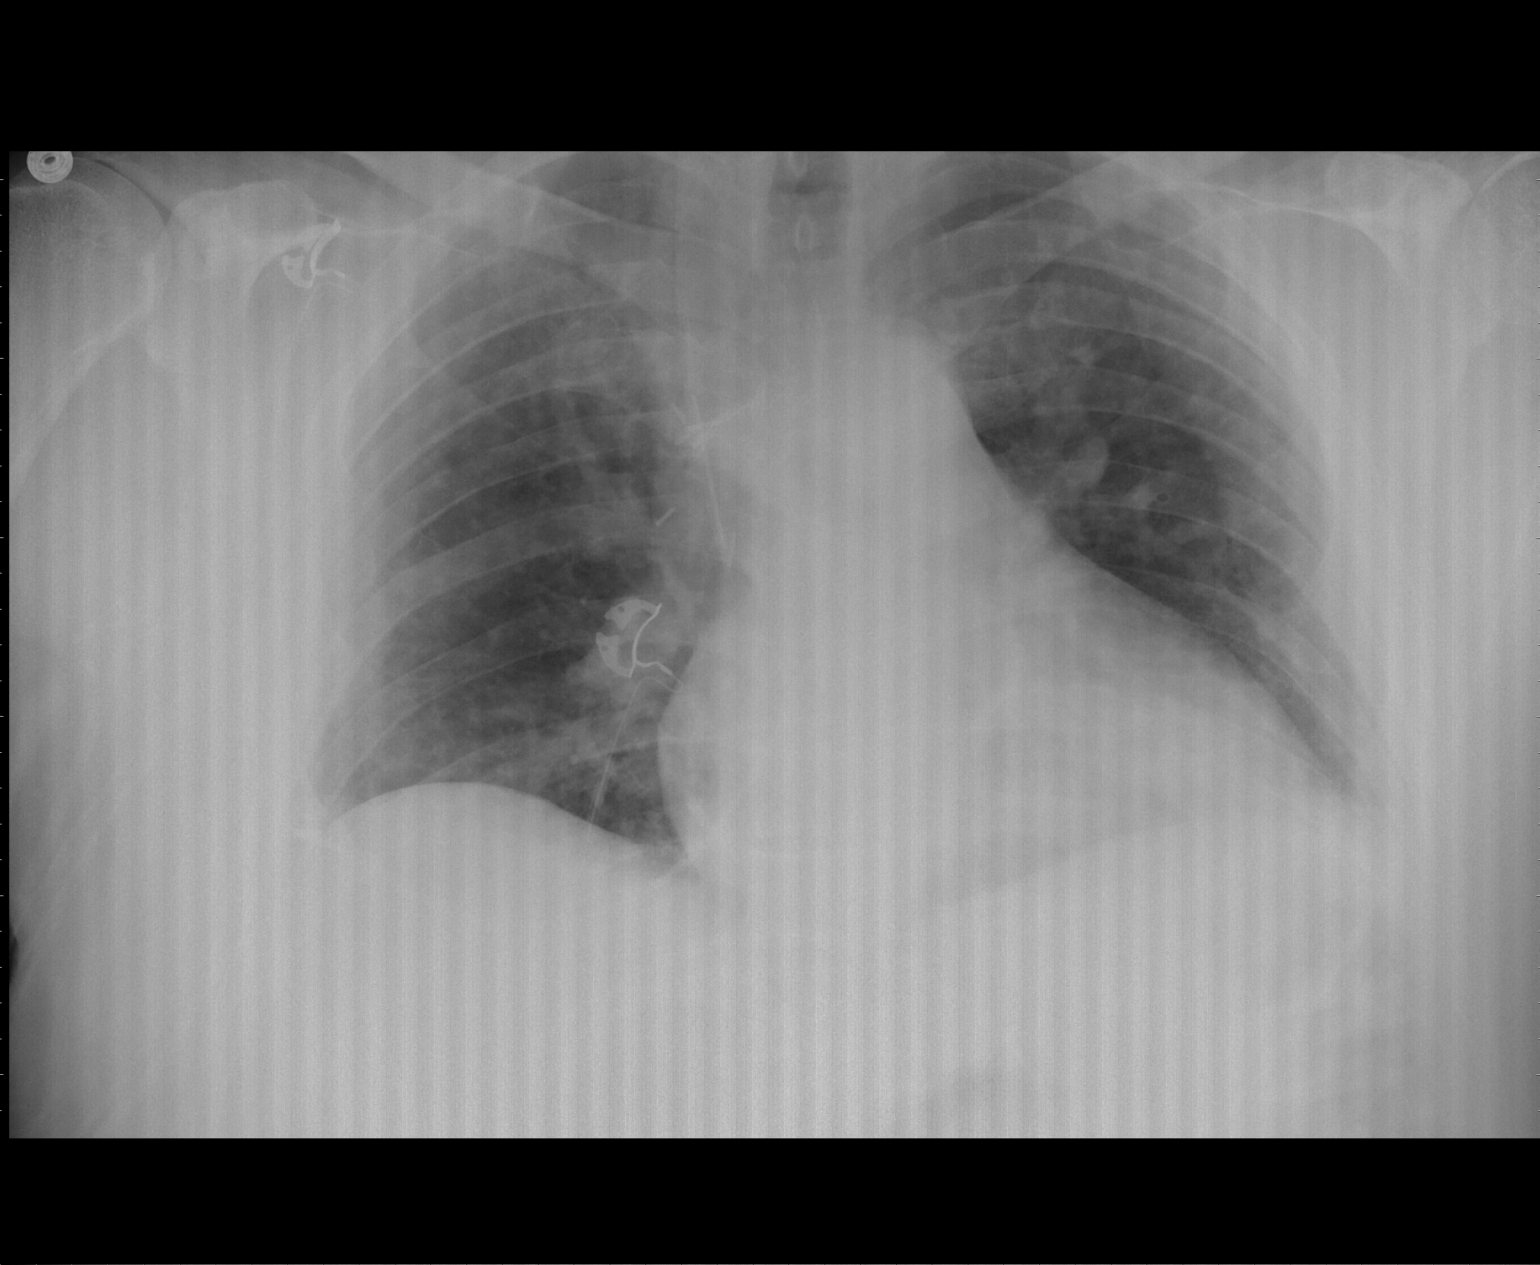

[2 of 2 positions shown; findings below may reference images not displayed]

FINDINGS: Right jugular central venous catheter stable, tip projecting over
SVC.
Right basilar thoracostomy tube.
Surgical clips right hilum.
Upper-normal size of cardiac silhouette.
Slight pulmonary vascular congestion.
Improving right basilar atelectasis.
Persistent atelectasis left base.
No definite infiltrate, pleural effusion, or pneumothorax.
Bones unremarkable.
IMPRESSION: Improved right basilar atelectasis with persistent atelectasis at
left base.

## 2012-08-07 ENCOUNTER — Other Ambulatory Visit (HOSPITAL_COMMUNITY): Payer: Self-pay | Admitting: Internal Medicine

## 2012-08-07 DIAGNOSIS — E042 Nontoxic multinodular goiter: Secondary | ICD-10-CM

## 2012-08-28 ENCOUNTER — Encounter (HOSPITAL_COMMUNITY)
Admission: RE | Admit: 2012-08-28 | Discharge: 2012-08-28 | Disposition: A | Discharge status: Home / Self Care | Payer: Managed Care, Other (non HMO) | Source: Ambulatory Visit | Attending: Internal Medicine | Admitting: Internal Medicine

## 2012-08-28 DIAGNOSIS — E042 Nontoxic multinodular goiter: Secondary | ICD-10-CM

## 2012-08-29 ENCOUNTER — Encounter (HOSPITAL_COMMUNITY)
Admission: RE | Admit: 2012-08-29 | Discharge: 2012-08-29 | Disposition: A | Discharge status: Home / Self Care | Payer: Managed Care, Other (non HMO) | Source: Ambulatory Visit | Attending: Internal Medicine | Admitting: Internal Medicine

## 2012-08-29 MED ORDER — SODIUM PERTECHNETATE TC 99M INJECTION
10.0000 | Freq: Once | INTRAVENOUS | Status: AC | PRN
  Administered 2012-08-29: 10 via INTRAVENOUS

## 2012-08-29 MED ORDER — SODIUM IODIDE I 131 CAPSULE
10.9000 | Freq: Once | INTRAVENOUS | Status: AC | PRN
  Administered 2012-08-28: 10.9 via ORAL

## 2012-12-10 ENCOUNTER — Other Ambulatory Visit: Payer: Self-pay | Admitting: Internal Medicine

## 2012-12-10 ENCOUNTER — Ambulatory Visit
Admission: RE | Admit: 2012-12-10 | Discharge: 2012-12-10 | Disposition: A | Discharge status: Home / Self Care | Payer: Managed Care, Other (non HMO) | Source: Ambulatory Visit | Attending: Internal Medicine | Admitting: Internal Medicine

## 2012-12-10 DIAGNOSIS — M7732 Calcaneal spur, left foot: Secondary | ICD-10-CM

## 2013-11-29 IMAGING — CR DG OS CALCIS 2+V*L*
2 series · 2 of 2 positions shown · non-contrast
Comparison: None.

CLINICAL DATA: Worsening plantar pain, no injury

EXAM:
LEFT OS CALCIS - 2+ VIEW

[view not recorded (1 of 2)]
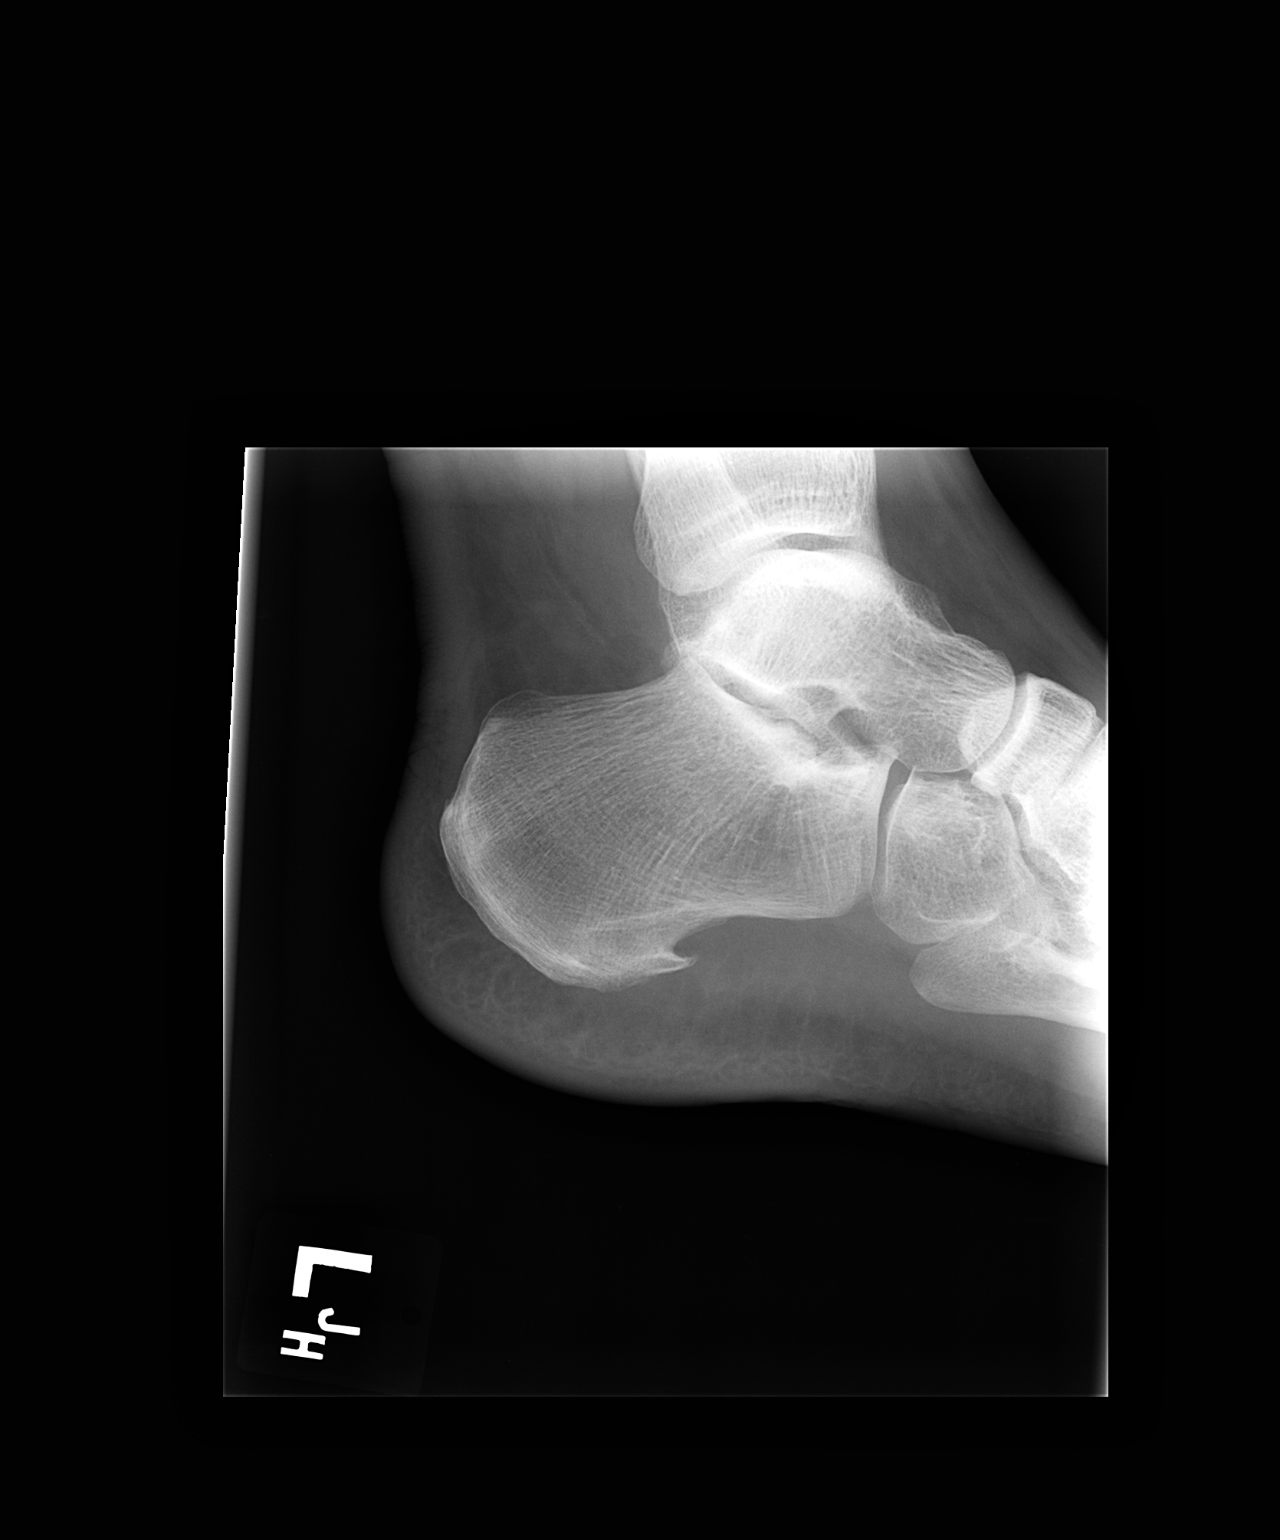

[view not recorded (2 of 2)]
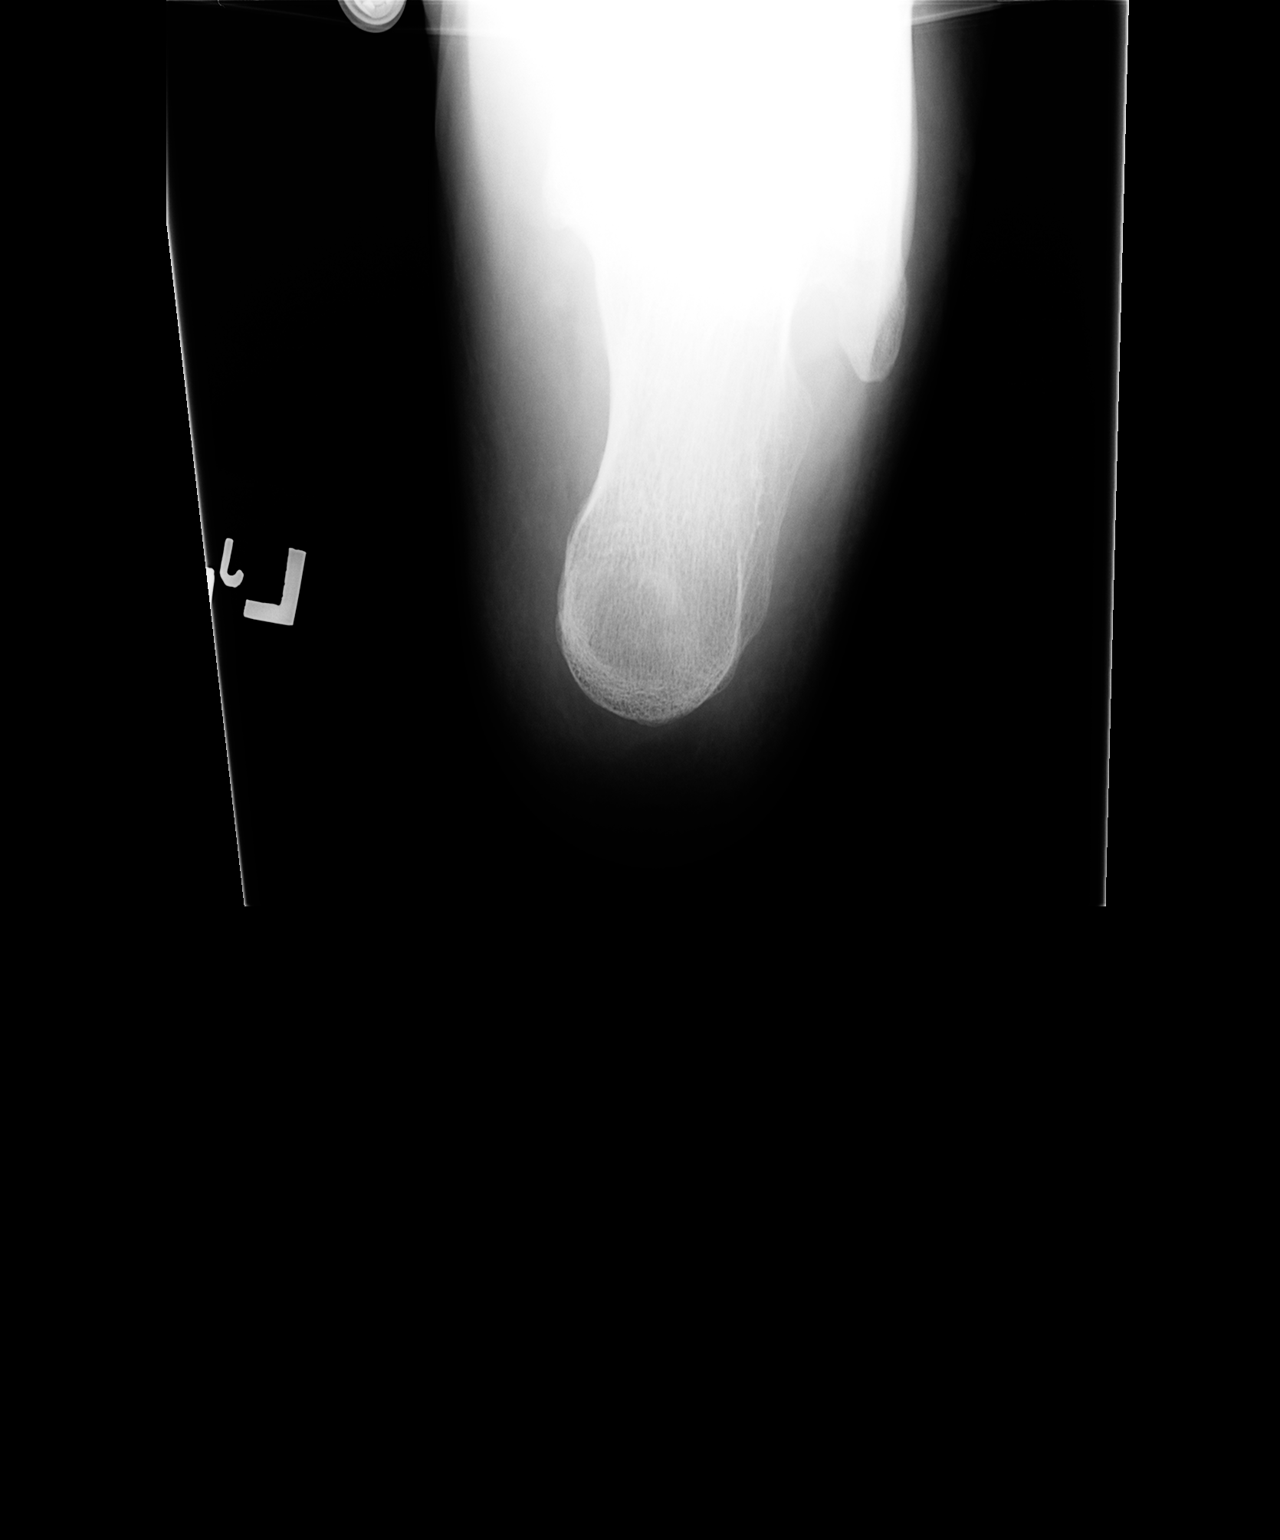

[2 of 2 positions shown; findings below may reference images not displayed]

FINDINGS: There is a small to moderate-sized degenerative spur emanating from
the plantar aspect of the calcaneus. No acute abnormality is seen.
There may be mild degenerative change also involving the subtalar
joint.
IMPRESSION: Small to moderate-sized degenerative plantar calcaneal spur.

## 2015-07-26 ENCOUNTER — Ambulatory Visit: Payer: Managed Care, Other (non HMO) | Admitting: Neurology

## 2015-07-26 DIAGNOSIS — Z029 Encounter for administrative examinations, unspecified: Secondary | ICD-10-CM

## 2015-09-02 ENCOUNTER — Other Ambulatory Visit (INDEPENDENT_AMBULATORY_CARE_PROVIDER_SITE_OTHER): Payer: Managed Care, Other (non HMO)

## 2015-09-02 ENCOUNTER — Ambulatory Visit (INDEPENDENT_AMBULATORY_CARE_PROVIDER_SITE_OTHER): Payer: Managed Care, Other (non HMO) | Admitting: Neurology

## 2015-09-02 ENCOUNTER — Encounter: Payer: Self-pay | Admitting: Neurology

## 2015-09-02 VITALS — BP 110/80 | HR 62 | Ht 73.0 in | Wt 292.2 lb

## 2015-09-02 DIAGNOSIS — H8110 Benign paroxysmal vertigo, unspecified ear: Secondary | ICD-10-CM | POA: Diagnosis not present

## 2015-09-02 DIAGNOSIS — R202 Paresthesia of skin: Secondary | ICD-10-CM

## 2015-09-02 LAB — VITAMIN B12: VITAMIN B 12: 240 pg/mL (ref 211–911)

## 2015-09-02 NOTE — Progress Notes (Signed)
[facility] Clinic Note - Initial Visit   Date: 09/02/2015  Jery Cerbone MRN: [id number] DOB: 04-20-1965   Dear Dr. Marjory Lies:  Thank you for your kind referral of Preston Cole for consultation of transient neurological symptoms. Although his history is well known to you, please allow [name] to reiterate it for the purpose of our medical record. The patient was accompanied to the clinic by wife who also provides collateral information.     History of Present Illness: Preston Cole is a 50 y.o. right-handed Caucasian male with hypertension, panic attacks, and GERD presenting for evaluation of dizziness and numbness involving the hands.  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  Around mid-April 2017, he was at work and talking to a Social worker.  When he turned to walk away, he suddenly became dizzy as if everything was spinning.  He knelt down on one knee, and within 15-20 seconds, it self-resolved.  He stablized himself and within walking 10 feet, he again became very unsteady and experienced dizziness.  He again rested, and was able to walk into his facility to the nursing station.  His blood pressure was 158/100.  He also developed numbness of the left face, arm, and hand. He went to [facility] Emergency Department where he had CT head, MRI brain, MRI cervical spine, and CXR.  Unfortunately, I do not have these images to review personally, but he was told there was white matter changes on his imaging for which he was referred here. Within a few hours of reaching the ED, his symptoms resolved.  Of note, his heart rate was noted to be in the 40s while sleeping, but he remained asymptomatic.  His metoprolol was reduced to 25mg  daily and he was started on aspirin 81mg  daily. No associated preceding illness or headache.   He continues to have some intermittent numbness of the left > right hand, which is exacerbated by driving and reading. He has  rare spells of imbalance, as if he is veering to one side but no more dizziness.  No vision changes, weakness, difficult swallowing or talking.    Out-side paper records, electronic medical record, and images have been reviewed where available and summarized as:  MRI brain without contrast 06/17/2015:  No acute intracranial abnormality noted.  Findings of possible small vessel ischemic deep white matter disease through other causes of demyelination or gliosis are not excluded, correlate clinically.  [name] carotis 06/17/2015:  Bilateral ICA stenosis <50%  Echo 06/17/2015:  Normal LV function, EF 65%.  Past Medical History  Diagnosis Date  . Hypertension     takes medications daily  . Panic attacks     paxil, ativan taken  . Pneumonia     hx of 2011  . GERD (gastroesophageal reflux disease)     prilosec  . Seasonal allergic conjunctivitis     Past Surgical History  Procedure Laterality Date  . Video assisted thoracoscopy  07/03/2011    Procedure: VIDEO ASSISTED THORACOSCOPY;  Surgeon: Melrose Nakayama, MD;  Location: [facility];  Service: Thoracic;  Laterality: Right;  Right Video Assisted Thorascopic Surgery ,Thymectomy with RESECTION OF ANTERIOR MEDIASTINAL MASS     Medications:  Outpatient Encounter Prescriptions as of 09/02/2015  Medication Sig  . lisinopril (PRINIVIL,ZESTRIL) 10 MG tablet Take 10 mg by mouth daily.  Marland Kitchen LORazepam (ATIVAN) 0.5 MG tablet Take 0.5 mg by mouth every 8 (eight) hours as needed. For anxiety  . metoprolol succinate (TOPROL-XL) 50 MG 24  hr tablet Take 25 mg by mouth daily. Take with or immediately following a meal.  . omeprazole (PRILOSEC) 40 MG capsule Take 40 mg by mouth daily.  Marland Kitchen oxyCODONE-acetaminophen (PERCOCET) 5-325 MG per tablet   . PARoxetine (PAXIL) 10 MG tablet Take 10-20 mg by mouth every morning.   Marland Kitchen PRESCRIPTION MEDICATION Take 1 tablet by mouth 2 (two) times daily. Oral Testosterone supplement (troche)  . traMADol (ULTRAM) 50 MG tablet    No  facility-administered encounter medications on file as of 09/02/2015.     Allergies:  Allergies  Allergen Reactions  . Niaspan [Niacin Er]     flushing  . Penicillins Other (See Comments)    Childhood allergy " does not remember"    Family History: Family History  Problem Relation Age of Onset  . Stroke Father     Deceased, 70  . Cancer Father   . Cancer Brother     bladder  . Anesthesia problems Neg Hx   . Hypotension Neg Hx   . Malignant hyperthermia Neg Hx   . Pseudochol deficiency Neg Hx   . Hypertension Mother     Living, 75  . Healthy Sister   . Healthy Daughter     Social History: Social History  Substance Use Topics  . Smoking status: Never Smoker   . Smokeless tobacco: Never Used  . Alcohol Use: 0.0 oz/week    0 Standard drinks or equivalent per week     Comment: Rare    Social History   Social History Narrative   Lives with wife in a one story home with a basement.  Has 4 children.     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   Education: some college.    Review of Systems:  CONSTITUTIONAL: No fevers, chills, night sweats, or weight loss.   EYES: No visual changes or eye pain ENT: No hearing changes.  No history of nose bleeds.   RESPIRATORY: No cough, wheezing and shortness of breath.   CARDIOVASCULAR: Negative for chest pain, and palpitations.   GI: Negative for abdominal discomfort, blood in stools or black stools.  No recent change in bowel habits.   GU:  No history of incontinence.   MUSCLOSKELETAL: No history of joint pain or swelling.  No myalgias.   SKIN: Negative for lesions, rash, and itching.   HEMATOLOGY/ONCOLOGY: Negative for prolonged bleeding, bruising easily, and swollen nodes.  No history of cancer.   ENDOCRINE: Negative for cold or heat intolerance, polydipsia or goiter.   PSYCH:  No depression or anxiety symptoms.   NEURO: As Above.   Vital Signs:  BP 110/80 mmHg  Pulse 62  Ht 6\' 1"  (1.854 m)   Wt 292 lb 4 oz (132.564 kg)  BMI 38.57 kg/m2  SpO2 98% Pain Scale: 0 on a scale of 0-10   General Medical Exam:  General:  Well appearing, comfortable.   Eyes/ENT: see cranial nerve examination.   Neck: No masses appreciated.  Full range of motion without tenderness.  No carotid bruits. Respiratory:  Clear to auscultation, good air entry bilaterally.   Cardiac:  Regular rate and rhythm, no murmur.   Extremities:  No deformities, edema, or skin discoloration.  Skin:  No rashes or lesions.  Neurological Exam: MENTAL STATUS including orientation to time, place, person, recent and remote memory, attention span and concentration, language, and fund of knowledge is normal.  Speech is not dysarthric.  CRANIAL NERVES: II:  No visual field defects.  Unremarkable fundi.   III-IV-VI: Pupils equal round and reactive to light.  Normal conjugate, extra-ocular eye movements in all directions of gaze.  No nystagmus.  Subtle left ptosis.   V:  Normal facial sensation.   VII:  Normal facial symmetry and movements.  No pathologic facial reflexes.  VIII:  Normal hearing and vestibular function.   IX-X:  Normal palatal movement.   XI:  Normal shoulder shrug and head rotation.   XII:  Normal tongue strength and range of motion, no deviation or fasciculation.  MOTOR:  No atrophy, fasciculations or abnormal movements.  No pronator drift.  Tone is normal.    Right Upper Extremity:    Left Upper Extremity:    Deltoid  5/5   Deltoid  5/5   Biceps  5/5   Biceps  5/5   Triceps  5/5   Triceps  5/5   Wrist extensors  5/5   Wrist extensors  5/5   Wrist flexors  5/5   Wrist flexors  5/5   Finger extensors  5/5   Finger extensors  5/5   Finger flexors  5/5   Finger flexors  5/5   Dorsal interossei  5/5   Dorsal interossei  5/5   Abductor pollicis  5/5   Abductor pollicis  5/5   Tone (Ashworth scale)  0  Tone (Ashworth scale)  0   Right Lower Extremity:    Left Lower Extremity:    Hip flexors  5/5   Hip  flexors  5/5   Hip extensors  5/5   Hip extensors  5/5   Knee flexors  5/5   Knee flexors  5/5   Knee extensors  5/5   Knee extensors  5/5   Dorsiflexors  5/5   Dorsiflexors  5/5   Plantarflexors  5/5   Plantarflexors  5/5   Toe extensors  5/5   Toe extensors  5/5   Toe flexors  5/5   Toe flexors  5/5   Tone (Ashworth scale)  0  Tone (Ashworth scale)  0   MSRs:  Right                                                                 Left brachioradialis 2+  brachioradialis 2+  biceps 2+  biceps 2+  triceps 2+  triceps 2+  patellar 2+  patellar 2+  ankle jerk 2+  ankle jerk 2+  Hoffman no  Hoffman no  plantar response down  plantar response down   SENSORY:  Normal and symmetric perception of light touch, pinprick, vibration, and proprioception.  Romberg's sign absent.   COORDINATION/GAIT: Normal finger-to- nose-finger and heel-to-shin.  Intact rapid alternating movements bilaterally.  Able to rise from a chair without using arms.  Gait narrow based and stable. Tandem and stressed gait intact.    IMPRESSION: Mr. Beissel is a delightful 50 year-old gentleman referred for evaluation of transient dizziness and left sided paresthesias.  His exam is entirely normal and non-focal.  His transient spell of dizziness sounds like vertigo, likely benign paroxysmal positional vertigo, especially since it was triggered by head movement.  I would like to view his MRI brain and cervical spine personally, to be sure there is nothing centrally to explain his his left  face and arm paresthesias.  If this only shows chronic microvascular changes, it is possible that symptoms may be due to elevated blood pressure.  To further characterize his arm paresthesias, I will schedule him for electrodiagnostic testing to look for nerve entrapment.      PLAN/RECOMMENDATIONS:  1.  NCS/EMG of the upper extremities 2.  Check vitamin B12 3.  Follow-up with primary care doctor about getting a sleep study 4.  Continue  aspirin 81mg  daily for secondary risk prevention 5.  Patient requested to bring images on CD for me to review  Return to clinic in 3 months  The duration of this appointment visit was 50 minutes of face-to-face time with the patient.  Greater than 50% of this time was spent in counseling, explanation of diagnosis, planning of further management, and coordination of care.   Thank you for allowing me to participate in patient's care.  If I can answer any additional questions, I would be pleased to do so.    Sincerely,    Tanique Matney K. Posey Pronto, DO

## 2015-09-02 NOTE — Patient Instructions (Addendum)
1.  NCS/EMG of the upper extremities 2.  Check vitamin B12  3.  Please follow-up with your primary care doctor about getting a sleep study 4.  Continue aspirin 81mg  daily 5.  Please have the imaging center burn a CD for me to review  Return to clinic in 3 months

## 2015-09-03 NOTE — Progress Notes (Signed)
Note routed

## 2015-09-06 ENCOUNTER — Encounter: Payer: Managed Care, Other (non HMO) | Admitting: Neurology

## 2015-09-08 ENCOUNTER — Ambulatory Visit: Payer: Managed Care, Other (non HMO) | Admitting: Neurology

## 2015-09-21 ENCOUNTER — Telehealth: Payer: Self-pay | Admitting: Neurology

## 2015-09-21 ENCOUNTER — Ambulatory Visit (INDEPENDENT_AMBULATORY_CARE_PROVIDER_SITE_OTHER): Payer: Managed Care, Other (non HMO) | Admitting: Neurology

## 2015-09-21 DIAGNOSIS — G5603 Carpal tunnel syndrome, bilateral upper limbs: Secondary | ICD-10-CM

## 2015-09-21 DIAGNOSIS — R202 Paresthesia of skin: Secondary | ICD-10-CM

## 2015-09-21 NOTE — Procedures (Signed)
[facility]  [address], [address]  [place], [place] [address] Tel: [phone] Fax:  [phone] Test Date:  09/21/2015  Patient: Preston Cole DOB: 08/18/1965 Physician: Narda Amber, DO  Sex: Male Height: 6\' 1"  Ref Phys: Narda Amber, DO  ID#: [id number] Temp: 33.2C Technician: Jerilynn Mages. Dean   Patient Complaints: This is a 50 year old gentleman referred for evaluation of bilateral hand numbness and tingling, worse on the left.  NCV & EMG Findings: Extensive electrodiagnostic testing of the left upper extremity and additional studies of the right shows: 1. The left median sensory nerve shows prolonged distal peak latency (4.9 ms) and reduced amplitude (11.2 V).  The right median sensory nerve shows prolonged distal peak latency (4.4 ms). Bilateral ulnar sensory responses are within normal limits. 2. Bilateral median motor responses show prolonged distal onset latency (L4.9, R4.3 ms).  Bilateral ulnar motor responses are within normal limits. 3. Sparse chronic motor axon loss changes isolated to the left abductor pollicis brevis, without accompanied active denervation. The findings are not present in the right upper extremity.   Impression: 1. Left median neuropathy at or distal to the wrist, consistent with clinical diagnosis of carpal tunnel syndrome; moderate-to-severe in degree electrically. 2. Right median neuropathy at or distal to the wrist, consistent with clinical diagnosis of carpal tunnel syndrome; moderate in degree electrically.    ___________________________ Narda Amber, DO    Nerve Conduction Studies Anti Sensory Summary Table   Site NR Peak (ms) Norm Peak (ms) P-T Amp (V) Norm P-T Amp  Left Median Anti Sensory (2nd Digit)  33.2C  Wrist    4.9 <3.6 11.2 >15  Right Median Anti Sensory (2nd Digit)  33.2C  Wrist    4.4 <3.6 17.9 >15  Left Ulnar Anti Sensory (5th Digit)  33.2C  Wrist    2.8 <3.1 12.6 >10  Right Ulnar Anti Sensory (5th Digit)   33.2C  Wrist    3.1 <3.1 13.1 >10   Motor Summary Table   Site NR Onset (ms) Norm Onset (ms) O-P Amp (mV) Norm O-P Amp Site1 Site2 Delta-0 (ms) Dist (cm) Vel (m/s) Norm Vel (m/s)  Left Median Motor (Abd Poll Brev)  33.2C  Wrist    4.9 <4.0 7.6 >6 Elbow Wrist 5.2 27.0 52 >50  Elbow    10.1  6.7         Right Median Motor (Abd Poll Brev)  33.2C  Wrist    4.3 <4.0 9.7 >6 Elbow Wrist 5.1 27.0 53 >50  Elbow    9.4  8.3         Left Ulnar Motor (Abd Dig Minimi)  33.2C  Wrist    3.0 <3.1 8.9 >7 B Elbow Wrist 4.3 26.0 60 >50  B Elbow    7.3  8.7  A Elbow B Elbow 1.5 10.0 67 >50  A Elbow    8.8  8.6         Right Ulnar Motor (Abd Dig Minimi)  33.2C  Wrist    3.0 <3.1 9.9 >7 B Elbow Wrist 4.8 25.0 52 >50  B Elbow    7.8  9.1  A Elbow B Elbow 1.6 10.0 62 >50  A Elbow    9.4  8.6          EMG   Side Muscle Ins Act Fibs Psw Fasc Number Recrt Dur Dur. Amp Amp. Poly Poly. Comment  Left 1stDorInt Nml Nml Nml Nml Nml Nml Nml Nml Nml Nml Nml Nml N/A  Left Abd Poll Brev Nml Nml Nml Nml 1- Mod-R Few 1+ Few 1+ Nml Nml N/A  Left Ext Indicis Nml Nml Nml Nml Nml Nml Nml Nml Nml Nml Nml Nml N/A  Left PronatorTeres Nml Nml Nml Nml Nml Nml Nml Nml Nml Nml Nml Nml N/A  Left Biceps Nml Nml Nml Nml Nml Nml Nml Nml Nml Nml Nml Nml N/A  Left Triceps Nml Nml Nml Nml Nml Nml Nml Nml Nml Nml Nml Nml N/A  Left Deltoid Nml Nml Nml Nml Nml Nml Nml Nml Nml Nml Nml Nml N/A  Right 1stDorInt Nml Nml Nml Nml Nml Nml Nml Nml Nml Nml Nml Nml N/A  Right Abd Poll Brev Nml Nml Nml Nml Nml Nml Nml Nml Nml Nml Nml Nml N/A  Right PronatorTeres Nml Nml Nml Nml Nml Nml Nml Nml Nml Nml Nml Nml N/A      Waveforms:

## 2015-09-21 NOTE — Telephone Encounter (Signed)
EMG results discussed with patient which shows bilateral CTS, worse on the left. Wrist splints recommended and if there is no improvement and his follow-up visit on the left, he will be referred to orthopedics for CTS release.

## 2015-10-14 ENCOUNTER — Encounter: Payer: Managed Care, Other (non HMO) | Admitting: Neurology

## 2015-11-19 ENCOUNTER — Ambulatory Visit: Payer: Managed Care, Other (non HMO) | Admitting: Neurology

## 2015-11-19 NOTE — Progress Notes (Deleted)
Follow-up Visit   Date: 11/19/15    Amadi Sibayan MRN: [id number] DOB: Jul 16, 1965   Interim History: Ziya Vereb is a 50 y.o. right-handed Caucasian male with hypertension, panic attacks, and GERD returning to the clinic for follow-up of bilateral CTS.  The patient was accompanied to the clinic by *** who also provides collateral information.    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  Around mid-April 2017, he was at work and talking to a Social worker.  When he turned to walk away, he suddenly became dizzy as if everything was spinning.  He knelt down on one knee, and within 15-20 seconds, it self-resolved.  He stablized himself and within walking 10 feet, he again became very unsteady and experienced dizziness.  He again rested, and was able to walk into his facility to the nursing station.  His blood pressure was 158/100.  He also developed numbness of the left face, arm, and hand. He went to [facility] Emergency Department where he had CT head, MRI brain, MRI cervical spine, and CXR.  Unfortunately, I do not have these images to review personally, but he was told there was white matter changes on his imaging for which he was referred here. Within a few hours of reaching the ED, his symptoms resolved.  Of note, his heart rate was noted to be in the 40s while sleeping, but he remained asymptomatic.  His metoprolol was reduced to 25mg  daily and he was started on aspirin 81mg  daily. No associated preceding illness or headache.   He continues to have some intermittent numbness of the left > right hand, which is exacerbated by driving and reading. He has rare spells of imbalance, as if he is veering to one side but no more dizziness.  No vision changes, weakness, difficult swallowing or talking.    UPDATE 11/19/2015:  ***  Medications:  Current Outpatient Prescriptions on File Prior to Visit  Medication Sig Dispense Refill  .  lisinopril (PRINIVIL,ZESTRIL) 10 MG tablet Take 10 mg by mouth daily.    Marland Kitchen LORazepam (ATIVAN) 0.5 MG tablet Take 0.5 mg by mouth every 8 (eight) hours as needed. For anxiety    . metoprolol succinate (TOPROL-XL) 50 MG 24 hr tablet Take 25 mg by mouth daily. Take with or immediately following a meal.    . omeprazole (PRILOSEC) 40 MG capsule Take 40 mg by mouth daily.    Marland Kitchen oxyCODONE-acetaminophen (PERCOCET) 5-325 MG per tablet     . PARoxetine (PAXIL) 10 MG tablet Take 10-20 mg by mouth every morning.     Marland Kitchen PRESCRIPTION MEDICATION Take 1 tablet by mouth 2 (two) times daily. Oral Testosterone supplement (troche)    . traMADol (ULTRAM) 50 MG tablet      No current facility-administered medications on file prior to visit.     Allergies:  Allergies  Allergen Reactions  . Niaspan [Niacin Er]     flushing  . Penicillins Other (See Comments)    Childhood allergy " does not remember"    Review of Systems:  CONSTITUTIONAL: No fevers, chills, night sweats, or weight loss.  EYES: No visual changes or eye pain ENT: No hearing changes.  No history of nose bleeds.   RESPIRATORY: No cough, wheezing and shortness of breath.   CARDIOVASCULAR: Negative for chest pain, and palpitations.   GI: Negative for abdominal discomfort, blood in stools or black stools.  No recent change in bowel habits.   GU:  No history of incontinence.   MUSCLOSKELETAL: No history of joint pain or swelling.  No myalgias.   SKIN: Negative for lesions, rash, and itching.   ENDOCRINE: Negative for cold or heat intolerance, polydipsia or goiter.   PSYCH:  *** depression or anxiety symptoms.   NEURO: As Above.   Vital Signs:  There were no vitals taken for this visit.  Neurological Exam: MENTAL STATUS including orientation to time, place, person, recent and remote memory, attention span and concentration, language, and fund of knowledge is ***normal.  Speech is not dysarthric.  CRANIAL NERVES: No visual field defects. ***  Pupils equal round and reactive to light.  Normal conjugate, extra-ocular eye movements in all directions of gaze.  No ptosis ***. Normal facial sensation.  Face is symmetric. Palate elevates symmetrically.  Tongue is midline.  MOTOR:  Motor strength is 5/5 in all extremities, except ***.  No atrophy, fasciculations or abnormal movements.  No pronator drift.  Tone is normal.    MSRs:  Reflexes are 2+/4 throughout, except ***.  SENSORY:  Intact to ***.  COORDINATION/GAIT:  Normal finger-to- nose-finger and heel-to-shin.  Intact rapid alternating movements bilaterally.  Gait narrow based and stable.   Data:*** MRI brain without contrast 06/17/2015:  No acute intracranial abnormality noted.  Findings of possible small vessel ischemic deep white matter disease through other causes of demyelination or gliosis are not excluded, correlate clinically.  [name] carotis 06/17/2015:  Bilateral ICA stenosis <50%  Echo 06/17/2015:  Normal LV function, EF 65%.  NCS/EMG upper extremities 09/21/2015: 1. Left median neuropathy at or distal to the wrist, consistent with clinical diagnosis of carpal tunnel syndrome; moderate-to-severe in degree electrically. 2. Right median neuropathy at or distal to the wrist, consistent with clinical diagnosis of carpal tunnel syndrome; moderate in degree electrically.  Lab Results  Component Value Date   VITAMINB12 240 09/02/2015    IMPRESSION/PLAN***: ***  1.  Bilateral CTS, worse on the left (moderate-to-severe) and moderate on the right  - ***  2.  Transient spell of dizziness sounds like vertigo, likely benign paroxysmal positional vertigo, especially since it was triggered by head movement. MRI brain reviewed and does not show anything that would explain left face and arm paresthesias, so these symptoms may have been due to elevated blood pressure.    3.  Low vitamin B12  - injections? ***  ***   The duration of this appointment visit was *** minutes of  face-to-face time with the patient.  Greater than 50% of this time was spent in counseling, explanation of diagnosis, planning of further management, and coordination of care.   Thank you for allowing me to participate in patient's care.  If I can answer any additional questions, I would be pleased to do so.    Sincerely,    Supriya Beaston K. Posey Pronto, DO
